# Patient Record
Sex: Male | Born: 1940
Health system: Southern US, Community
[De-identification: ages and names within clinical notes are randomized; demographics above are authoritative.]

## PROBLEM LIST (undated history)

## (undated) DIAGNOSIS — J189 Pneumonia, unspecified organism: Secondary | ICD-10-CM

## (undated) DIAGNOSIS — Z87442 Personal history of urinary calculi: Secondary | ICD-10-CM

## (undated) DIAGNOSIS — I10 Essential (primary) hypertension: Secondary | ICD-10-CM

## (undated) DIAGNOSIS — M48061 Spinal stenosis, lumbar region without neurogenic claudication: Secondary | ICD-10-CM

## (undated) DIAGNOSIS — R609 Edema, unspecified: Secondary | ICD-10-CM

## (undated) DIAGNOSIS — M1990 Unspecified osteoarthritis, unspecified site: Secondary | ICD-10-CM

## (undated) HISTORY — PX: OTHER SURGICAL HISTORY: SHX169

---

## 2015-05-03 ENCOUNTER — Other Ambulatory Visit: Payer: Self-pay | Admitting: Orthopedic Surgery

## 2015-05-03 DIAGNOSIS — M48061 Spinal stenosis, lumbar region without neurogenic claudication: Secondary | ICD-10-CM

## 2015-05-23 ENCOUNTER — Ambulatory Visit
Admission: RE | Admit: 2015-05-23 | Discharge: 2015-05-23 | Disposition: A | Discharge status: Home / Self Care | Payer: Medicare Other | Source: Ambulatory Visit | Attending: Orthopedic Surgery | Admitting: Orthopedic Surgery

## 2015-05-23 DIAGNOSIS — M48061 Spinal stenosis, lumbar region without neurogenic claudication: Secondary | ICD-10-CM

## 2015-05-23 MED ORDER — IOHEXOL 180 MG/ML  SOLN
15.0000 mL | Freq: Once | INTRAMUSCULAR | Status: DC | PRN
  Administered 2015-05-23: 15 mL via INTRATHECAL

## 2015-05-23 NOTE — Discharge Instructions (Signed)

## 2015-05-31 ENCOUNTER — Other Ambulatory Visit: Payer: Self-pay | Admitting: Surgical

## 2015-06-06 ENCOUNTER — Other Ambulatory Visit (HOSPITAL_COMMUNITY): Payer: Self-pay | Admitting: *Deleted

## 2015-06-06 NOTE — Patient Instructions (Signed)
Jim Murray  06/06/2015   Your procedure is scheduled on: 06-14-15  [facility]  [facility] to 3rd floor to  Short Stay Center at  630 AM.  Call this number if you have problems the morning of surgery [phone]   Remember: ONLY 1 PERSON MAY GO WITH YOU TO SHORT STAY TO GET  READY MORNING OF YOUR SURGERY.  Do not eat food or drink liquids :After Midnight.     Take these medicines the morning of surgery with A SIP OF WATER: none DO NOT TAKE ANY DIABETIC MEDICATIONS DAY OF YOUR SURGERY                               You may not have any metal on your body including hair pins and              piercings  Do not wear jewelry, make-up, lotions, powders or perfumes, deodorant             Do not wear nail polish.  Do not shave  48 hours prior to surgery.              Men may shave face and neck.   Do not bring valuables to the hospital. [facility] IS NOT             RESPONSIBLE   FOR VALUABLES.  Contacts, dentures or bridgework may not be worn into surgery.  Leave suitcase in the car. After surgery it may be brought to your room.     Patients discharged the day of surgery will not be allowed to drive home.  Name and phone number of your driver:  Special Instructions: N/A              Please read over the following fact sheets you were given: _____________________________________________________________________             [facility] - Preparing for [facility] Before surgery, you can play an important role.  Because skin is not sterile, your skin needs to be as free of germs as possible.  You can reduce the number of germs on your skin by washing with CHG (chlorahexidine gluconate) soap before surgery.  CHG is an antiseptic cleaner which kills germs and bonds with the skin to continue killing germs even after washing. Please DO NOT use if you have an allergy to CHG or antibacterial soaps.  If your skin becomes  reddened/irritated stop using the CHG and inform your nurse when you arrive at Short Stay. Do not shave (including legs and underarms) for at least 48 hours prior to the first CHG shower.  You may shave your face/neck. Please follow these instructions carefully:  1.  Shower with CHG Soap the night before surgery and the  morning of Surgery.  2.  If you choose to wash your hair, wash your hair first as usual with your  normal  shampoo.  3.  After you shampoo, rinse your hair and body thoroughly to remove the  shampoo.                           4.  Use CHG as you would any other liquid soap.  You can apply chg directly  to the skin and wash  Gently with a scrungie or clean washcloth.  5.  Apply the CHG Soap to your body ONLY FROM THE NECK DOWN.   Do not use on face/ open                           Wound or open sores. Avoid contact with eyes, ears mouth and genitals (private parts).                       Wash face,  Genitals (private parts) with your normal soap.             6.  Wash thoroughly, paying special attention to the area where your surgery  will be performed.  7.  Thoroughly rinse your body with warm water from the neck down.  8.  DO NOT shower/wash with your normal soap after using and rinsing off  the CHG Soap.                9.  Pat yourself dry with a clean towel.            10.  Wear clean pajamas.            11.  Place clean sheets on your bed the night of your first shower and do not  sleep with pets. Day of Surgery : Do not apply any lotions/deodorants the morning of surgery.  Please wear clean clothes to the hospital/surgery center.  FAILURE TO FOLLOW THESE INSTRUCTIONS MAY RESULT IN THE CANCELLATION OF YOUR SURGERY PATIENT SIGNATURE_________________________________  NURSE SIGNATURE__________________________________  ________________________________________________________________________   Jim Murray  An incentive spirometer is a tool that  can help keep your lungs clear and active. This tool measures how well you are filling your lungs with each breath. Taking long deep breaths may help reverse or decrease the chance of developing breathing (pulmonary) problems (especially infection) following:  A long period of time when you are unable to move or be active. BEFORE THE PROCEDURE   If the spirometer includes an indicator to show your best effort, your nurse or respiratory therapist will set it to a desired goal.  If possible, sit up straight or lean slightly forward. Try not to slouch.  Hold the incentive spirometer in an upright position. INSTRUCTIONS FOR USE  1. Sit on the edge of your bed if possible, or sit up as far as you can in bed or on a chair. 2. Hold the incentive spirometer in an upright position. 3. Breathe out normally. 4. Place the mouthpiece in your mouth and seal your lips tightly around it. 5. Breathe in slowly and as deeply as possible, raising the piston or the ball toward the top of the column. 6. Hold your breath for 3-5 seconds or for as long as possible. Allow the piston or ball to fall to the bottom of the column. 7. Remove the mouthpiece from your mouth and breathe out normally. 8. Rest for a few seconds and repeat Steps 1 through 7 at least 10 times every 1-2 hours when you are awake. Take your time and take a few normal breaths between deep breaths. 9. The spirometer may include an indicator to show your best effort. Use the indicator as a goal to work toward during each repetition. 10. After each set of 10 deep breaths, practice coughing to be sure your lungs are clear. If you have an incision (the cut made at the time of surgery),  support your incision when coughing by placing a pillow or rolled up towels firmly against it. Once you are able to get out of bed, walk around indoors and cough well. You may stop using the incentive spirometer when instructed by your caregiver.  RISKS AND  COMPLICATIONS  Take your time so you do not get dizzy or light-headed.  If you are in pain, you may need to take or ask for pain medication before doing incentive spirometry. It is harder to take a deep breath if you are having pain. AFTER USE  Rest and breathe slowly and easily.  It can be helpful to keep track of a log of your progress. Your caregiver can provide you with a simple table to help with this. If you are using the spirometer at home, follow these instructions: [place] IF:   You are having difficultly using the spirometer.  You have trouble using the spirometer as often as instructed.  Your pain medication is not giving enough relief while using the spirometer.  You develop fever of 100.5 F (38.1 Murray) or higher. SEEK IMMEDIATE MEDICAL CARE IF:   You cough up bloody sputum that had not been present before.  You develop fever of 102 F (38.9 Murray) or greater.  You develop worsening pain at or near the incision site. MAKE SURE YOU:   Understand these instructions.  Will watch your condition.  Will get help right away if you are not doing well or get worse. Document Released: 12/09/2006 Document Revised: 10/21/2011 Document Reviewed: 02/09/2007 [facility] Patient Information 2014 [place], [place].   ________________________________________________________________________

## 2015-06-08 ENCOUNTER — Encounter (HOSPITAL_COMMUNITY)
Admission: RE | Admit: 2015-06-08 | Discharge: 2015-06-08 | Disposition: A | Discharge status: Home / Self Care | Payer: Medicare Other | Source: Ambulatory Visit | Attending: Orthopedic Surgery | Admitting: Orthopedic Surgery

## 2015-06-08 ENCOUNTER — Ambulatory Visit (HOSPITAL_COMMUNITY)
Admission: RE | Admit: 2015-06-08 | Discharge: 2015-06-08 | Disposition: A | Discharge status: Home / Self Care | Payer: Medicare Other | Source: Ambulatory Visit | Attending: Surgical | Admitting: Surgical

## 2015-06-08 ENCOUNTER — Encounter (HOSPITAL_COMMUNITY): Payer: Self-pay

## 2015-06-08 DIAGNOSIS — Z01812 Encounter for preprocedural laboratory examination: Secondary | ICD-10-CM | POA: Insufficient documentation

## 2015-06-08 DIAGNOSIS — M4800 Spinal stenosis, site unspecified: Secondary | ICD-10-CM | POA: Diagnosis not present

## 2015-06-08 DIAGNOSIS — Z01818 Encounter for other preprocedural examination: Secondary | ICD-10-CM

## 2015-06-08 DIAGNOSIS — M5134 Other intervertebral disc degeneration, thoracic region: Secondary | ICD-10-CM | POA: Insufficient documentation

## 2015-06-08 DIAGNOSIS — R001 Bradycardia, unspecified: Secondary | ICD-10-CM | POA: Insufficient documentation

## 2015-06-08 HISTORY — DX: Pneumonia, unspecified organism: J18.9

## 2015-06-08 HISTORY — DX: Spinal stenosis, lumbar region without neurogenic claudication: M48.061

## 2015-06-08 HISTORY — DX: Personal history of urinary calculi: Z87.442

## 2015-06-08 HISTORY — DX: Edema, unspecified: R60.9

## 2015-06-08 LAB — CBC WITH DIFFERENTIAL/PLATELET
Basophils Absolute: 0.1 10*3/uL (ref 0.0–0.1)
Basophils Relative: 1 %
Eosinophils Absolute: 0.2 10*3/uL (ref 0.0–0.7)
Eosinophils Relative: 4 %
HCT: 43.8 % (ref 39.0–52.0)
Hemoglobin: 14.3 g/dL (ref 13.0–17.0)
Lymphocytes Relative: 21 %
Lymphs Abs: 1.1 10*3/uL (ref 0.7–4.0)
MCH: 30 pg (ref 26.0–34.0)
MCHC: 32.6 g/dL (ref 30.0–36.0)
MCV: 91.8 fL (ref 78.0–100.0)
Monocytes Absolute: 0.7 10*3/uL (ref 0.1–1.0)
Monocytes Relative: 13 %
Neutro Abs: 3.2 10*3/uL (ref 1.7–7.7)
Neutrophils Relative %: 61 %
Platelets: 196 10*3/uL (ref 150–400)
RBC: 4.77 MIL/uL (ref 4.22–5.81)
RDW: 13.1 % (ref 11.5–15.5)
WBC: 5.1 10*3/uL (ref 4.0–10.5)

## 2015-06-08 LAB — PROTIME-INR
INR: 1.02 (ref 0.00–1.49)
Prothrombin Time: 13.6 seconds (ref 11.6–15.2)

## 2015-06-08 LAB — COMPREHENSIVE METABOLIC PANEL
ALT: 21 U/L (ref 17–63)
AST: 21 U/L (ref 15–41)
Albumin: 4.3 g/dL (ref 3.5–5.0)
Alkaline Phosphatase: 60 U/L (ref 38–126)
Anion gap: 5 (ref 5–15)
BUN: 15 mg/dL (ref 6–20)
CO2: 29 mmol/L (ref 22–32)
Calcium: 9.2 mg/dL (ref 8.9–10.3)
Chloride: 105 mmol/L (ref 101–111)
Creatinine, Ser: 0.85 mg/dL (ref 0.61–1.24)
GFR calc Af Amer: >60 mL/min (ref >60)
GFR calc non Af Amer: >60 mL/min (ref >60)
Glucose, Bld: 111 mg/dL — ABNORMAL HIGH (ref 65–99)
Potassium: 4.5 mmol/L (ref 3.5–5.1)
Sodium: 139 mmol/L (ref 135–145)
Total Bilirubin: 0.9 mg/dL (ref 0.3–1.2)
Total Protein: 7.5 g/dL (ref 6.5–8.1)

## 2015-06-08 LAB — SURGICAL PCR SCREEN
MRSA, PCR: NEGATIVE
STAPHYLOCOCCUS AUREUS: NEGATIVE

## 2015-06-08 NOTE — Progress Notes (Signed)
email confirmation on chart for spanish interpreter day of surgery 06-14-15 arrive 630 am [facility] short stay

## 2015-06-14 ENCOUNTER — Ambulatory Visit (HOSPITAL_COMMUNITY): Payer: Medicare Other

## 2015-06-14 ENCOUNTER — Ambulatory Visit (HOSPITAL_COMMUNITY): Payer: Medicare Other | Admitting: Anesthesiology

## 2015-06-14 ENCOUNTER — Encounter (HOSPITAL_COMMUNITY)
Admission: RE | Disposition: A | Discharge status: Home / Self Care | Payer: Self-pay | Source: Ambulatory Visit | Attending: Orthopedic Surgery

## 2015-06-14 ENCOUNTER — Observation Stay (HOSPITAL_COMMUNITY)
Admission: RE | Admit: 2015-06-14 | Discharge: 2015-06-15 | Disposition: A | Discharge status: Home / Self Care | Payer: Medicare Other | Source: Ambulatory Visit | Attending: Orthopedic Surgery | Admitting: Orthopedic Surgery

## 2015-06-14 ENCOUNTER — Encounter (HOSPITAL_COMMUNITY): Payer: Self-pay | Admitting: *Deleted

## 2015-06-14 DIAGNOSIS — M21371 Foot drop, right foot: Secondary | ICD-10-CM | POA: Diagnosis not present

## 2015-06-14 DIAGNOSIS — M4806 Spinal stenosis, lumbar region: Secondary | ICD-10-CM | POA: Diagnosis not present

## 2015-06-14 DIAGNOSIS — Z419 Encounter for procedure for purposes other than remedying health state, unspecified: Secondary | ICD-10-CM

## 2015-06-14 DIAGNOSIS — M5126 Other intervertebral disc displacement, lumbar region: Secondary | ICD-10-CM | POA: Diagnosis not present

## 2015-06-14 DIAGNOSIS — Z87442 Personal history of urinary calculi: Secondary | ICD-10-CM | POA: Diagnosis not present

## 2015-06-14 DIAGNOSIS — M48062 Spinal stenosis, lumbar region with neurogenic claudication: Secondary | ICD-10-CM | POA: Diagnosis present

## 2015-06-14 HISTORY — PX: LUMBAR LAMINECTOMY/DECOMPRESSION MICRODISCECTOMY: SHX5026

## 2015-06-14 SURGERY — LUMBAR LAMINECTOMY/DECOMPRESSION MICRODISCECTOMY 1 LEVEL
Anesthesia: Spinal | Site: Back | Laterality: Right

## 2015-06-14 MED ORDER — ACETAMINOPHEN 325 MG PO TABS: 650.0000 mg | ORAL_TABLET | ORAL | Status: DC | PRN

## 2015-06-14 MED ORDER — FLEET ENEMA 7-19 GM/118ML RE ENEM: 1.0000 | ENEMA | Freq: Once | RECTAL | Status: DC | PRN

## 2015-06-14 MED ORDER — GLYCOPYRROLATE 0.2 MG/ML IJ SOLN
INTRAMUSCULAR | Status: DC | PRN
  Administered 2015-06-14: 0.6 mg via INTRAVENOUS

## 2015-06-14 MED ORDER — PROPOFOL 10 MG/ML IV BOLUS
INTRAVENOUS | Status: AC
  Filled 2015-06-14: qty 20

## 2015-06-14 MED ORDER — PROPOFOL 10 MG/ML IV BOLUS
INTRAVENOUS | Status: DC | PRN
  Administered 2015-06-14: 150 mg via INTRAVENOUS

## 2015-06-14 MED ORDER — OXYCODONE-ACETAMINOPHEN 5-325 MG PO TABS
1.0000 | ORAL_TABLET | ORAL | Status: DC | PRN
  Administered 2015-06-14: 1 via ORAL
  Administered 2015-06-15 (×3): 2 via ORAL
  Filled 2015-06-14 (×3): qty 2
  Filled 2015-06-14: qty 1

## 2015-06-14 MED ORDER — ONDANSETRON HCL 4 MG/2ML IJ SOLN: 4.0000 mg | INTRAMUSCULAR | Status: DC | PRN

## 2015-06-14 MED ORDER — BUPIVACAINE-EPINEPHRINE 0.5% -1:200000 IJ SOLN
INTRAMUSCULAR | Status: DC | PRN
  Administered 2015-06-14: 20 mL

## 2015-06-14 MED ORDER — CEFAZOLIN SODIUM-DEXTROSE 2-3 GM-% IV SOLR
2.0000 g | INTRAVENOUS | Status: AC
  Administered 2015-06-14: 2 g via INTRAVENOUS

## 2015-06-14 MED ORDER — SODIUM CHLORIDE 0.9 % IR SOLN
Status: DC | PRN
  Administered 2015-06-14: 500 mL

## 2015-06-14 MED ORDER — LIDOCAINE HCL (CARDIAC) 20 MG/ML IV SOLN
INTRAVENOUS | Status: AC
  Filled 2015-06-14: qty 5

## 2015-06-14 MED ORDER — MENTHOL 3 MG MT LOZG: 1.0000 | LOZENGE | OROMUCOSAL | Status: DC | PRN

## 2015-06-14 MED ORDER — GLYCOPYRROLATE 0.2 MG/ML IJ SOLN
INTRAMUSCULAR | Status: AC
  Filled 2015-06-14: qty 3

## 2015-06-14 MED ORDER — LACTATED RINGERS IV SOLN
INTRAVENOUS | Status: DC
Start: 2015-06-14 — End: 2015-06-15
  Administered 2015-06-14 – 2015-06-15 (×2): via INTRAVENOUS

## 2015-06-14 MED ORDER — SUCCINYLCHOLINE CHLORIDE 20 MG/ML IJ SOLN
INTRAMUSCULAR | Status: DC | PRN
  Administered 2015-06-14: 100 mg via INTRAVENOUS

## 2015-06-14 MED ORDER — CEFAZOLIN SODIUM-DEXTROSE 2-3 GM-% IV SOLR
INTRAVENOUS | Status: AC
  Filled 2015-06-14: qty 50

## 2015-06-14 MED ORDER — HYDROCODONE-ACETAMINOPHEN 5-325 MG PO TABS: 1.0000 | ORAL_TABLET | ORAL | Status: DC | PRN

## 2015-06-14 MED ORDER — BISACODYL 5 MG PO TBEC: 5.0000 mg | DELAYED_RELEASE_TABLET | Freq: Every day | ORAL | Status: DC | PRN

## 2015-06-14 MED ORDER — NEOSTIGMINE METHYLSULFATE 10 MG/10ML IV SOLN
INTRAVENOUS | Status: DC | PRN
  Administered 2015-06-14: 4 mg via INTRAVENOUS

## 2015-06-14 MED ORDER — TAMSULOSIN HCL 0.4 MG PO CAPS
0.4000 mg | ORAL_CAPSULE | Freq: Every day | ORAL | Status: DC
  Administered 2015-06-14: 0.4 mg via ORAL
  Filled 2015-06-14 (×2): qty 1

## 2015-06-14 MED ORDER — THROMBIN 5000 UNITS EX SOLR
OROMUCOSAL | Status: DC | PRN
  Administered 2015-06-14: 10:00:00 via TOPICAL

## 2015-06-14 MED ORDER — FENTANYL CITRATE (PF) 250 MCG/5ML IJ SOLN
INTRAMUSCULAR | Status: AC
  Filled 2015-06-14: qty 25

## 2015-06-14 MED ORDER — BUPIVACAINE-EPINEPHRINE (PF) 0.5% -1:200000 IJ SOLN
INTRAMUSCULAR | Status: AC
  Filled 2015-06-14: qty 30

## 2015-06-14 MED ORDER — BACITRACIN-NEOMYCIN-POLYMYXIN 400-5-5000 EX OINT
TOPICAL_OINTMENT | CUTANEOUS | Status: AC
  Filled 2015-06-14: qty 1

## 2015-06-14 MED ORDER — MEPERIDINE HCL 50 MG/ML IJ SOLN: 6.2500 mg | INTRAMUSCULAR | Status: DC | PRN

## 2015-06-14 MED ORDER — PROMETHAZINE HCL 25 MG/ML IJ SOLN: 6.2500 mg | INTRAMUSCULAR | Status: DC | PRN

## 2015-06-14 MED ORDER — FENTANYL CITRATE (PF) 100 MCG/2ML IJ SOLN
INTRAMUSCULAR | Status: AC
  Filled 2015-06-14: qty 2

## 2015-06-14 MED ORDER — THROMBIN 5000 UNITS EX SOLR
CUTANEOUS | Status: AC
  Filled 2015-06-14: qty 10000

## 2015-06-14 MED ORDER — FENTANYL CITRATE (PF) 100 MCG/2ML IJ SOLN
25.0000 ug | INTRAMUSCULAR | Status: DC | PRN
  Administered 2015-06-14: 50 ug via INTRAVENOUS

## 2015-06-14 MED ORDER — SODIUM CHLORIDE 0.9 % IR SOLN
Status: AC
  Filled 2015-06-14: qty 1

## 2015-06-14 MED ORDER — ROCURONIUM BROMIDE 100 MG/10ML IV SOLN
INTRAVENOUS | Status: AC
  Filled 2015-06-14: qty 1

## 2015-06-14 MED ORDER — HYDROMORPHONE HCL 1 MG/ML IJ SOLN: 0.5000 mg | INTRAMUSCULAR | Status: DC | PRN

## 2015-06-14 MED ORDER — ONDANSETRON HCL 4 MG/2ML IJ SOLN
INTRAMUSCULAR | Status: DC | PRN
  Administered 2015-06-14: 4 mg via INTRAVENOUS

## 2015-06-14 MED ORDER — ONDANSETRON HCL 4 MG/2ML IJ SOLN
INTRAMUSCULAR | Status: AC
  Filled 2015-06-14: qty 2

## 2015-06-14 MED ORDER — ROCURONIUM BROMIDE 100 MG/10ML IV SOLN
INTRAVENOUS | Status: DC | PRN
  Administered 2015-06-14: 30 mg via INTRAVENOUS
  Administered 2015-06-14: 10 mg via INTRAVENOUS

## 2015-06-14 MED ORDER — BUPIVACAINE LIPOSOME 1.3 % IJ SUSP
20.0000 mL | Freq: Once | INTRAMUSCULAR | Status: AC
  Administered 2015-06-14: 20 mL
  Filled 2015-06-14: qty 20

## 2015-06-14 MED ORDER — DEXAMETHASONE SODIUM PHOSPHATE 10 MG/ML IJ SOLN
INTRAMUSCULAR | Status: DC | PRN
  Administered 2015-06-14: 10 mg via INTRAVENOUS

## 2015-06-14 MED ORDER — BACITRACIN-NEOMYCIN-POLYMYXIN 400-5-5000 EX OINT
TOPICAL_OINTMENT | CUTANEOUS | Status: DC | PRN
  Administered 2015-06-14: 1 via TOPICAL

## 2015-06-14 MED ORDER — METHOCARBAMOL 1000 MG/10ML IJ SOLN
500.0000 mg | Freq: Four times a day (QID) | INTRAMUSCULAR | Status: DC | PRN
  Administered 2015-06-14: 500 mg via INTRAVENOUS
  Filled 2015-06-14 (×2): qty 5

## 2015-06-14 MED ORDER — ACETAMINOPHEN 650 MG RE SUPP: 650.0000 mg | RECTAL | Status: DC | PRN

## 2015-06-14 MED ORDER — CEFAZOLIN SODIUM 1-5 GM-% IV SOLN
1.0000 g | Freq: Three times a day (TID) | INTRAVENOUS | Status: AC
  Administered 2015-06-14 – 2015-06-15 (×3): 1 g via INTRAVENOUS
  Filled 2015-06-14 (×3): qty 50

## 2015-06-14 MED ORDER — POLYETHYLENE GLYCOL 3350 17 G PO PACK: 17.0000 g | PACK | Freq: Every day | ORAL | Status: DC | PRN

## 2015-06-14 MED ORDER — LACTATED RINGERS IV SOLN
INTRAVENOUS | Status: DC
  Administered 2015-06-14 (×2): via INTRAVENOUS

## 2015-06-14 MED ORDER — NEOSTIGMINE METHYLSULFATE 10 MG/10ML IV SOLN
INTRAVENOUS | Status: AC
  Filled 2015-06-14: qty 1

## 2015-06-14 MED ORDER — FENTANYL CITRATE (PF) 250 MCG/5ML IJ SOLN
INTRAMUSCULAR | Status: DC | PRN
  Administered 2015-06-14 (×5): 50 ug via INTRAVENOUS

## 2015-06-14 MED ORDER — PHENOL 1.4 % MT LIQD: 1.0000 | OROMUCOSAL | Status: DC | PRN

## 2015-06-14 MED ORDER — METHOCARBAMOL 500 MG PO TABS
500.0000 mg | ORAL_TABLET | Freq: Four times a day (QID) | ORAL | Status: DC | PRN
  Administered 2015-06-15: 500 mg via ORAL
  Filled 2015-06-14: qty 1

## 2015-06-14 SURGICAL SUPPLY — 37 items
BAG ZIPLOCK 12X15 (MISCELLANEOUS) ×3 IMPLANT
CLEANER TIP ELECTROSURG 2X2 (MISCELLANEOUS) ×3 IMPLANT
DRAPE MICROSCOPE LEICA (MISCELLANEOUS) ×3 IMPLANT
DRAPE POUCH INSTRU U-SHP 10X18 (DRAPES) ×3 IMPLANT
DRAPE SHEET LG 3/4 BI-LAMINATE (DRAPES) ×3 IMPLANT
DRAPE SURG 17X11 SM STRL (DRAPES) ×3 IMPLANT
DRSG ADAPTIC 3X8 NADH LF (GAUZE/BANDAGES/DRESSINGS) ×3 IMPLANT
DRSG PAD ABDOMINAL 8X10 ST (GAUZE/BANDAGES/DRESSINGS) ×6 IMPLANT
DURAPREP 26ML APPLICATOR (WOUND CARE) ×3 IMPLANT
ELECT BLADE TIP CTD 4 INCH (ELECTRODE) ×3 IMPLANT
ELECT REM PT RETURN 9FT ADLT (ELECTROSURGICAL) ×3
ELECTRODE REM PT RTRN 9FT ADLT (ELECTROSURGICAL) ×1 IMPLANT
GAUZE SPONGE 4X4 12PLY STRL (GAUZE/BANDAGES/DRESSINGS) ×3 IMPLANT
GLOVE BIOGEL PI IND STRL 8 (GLOVE) ×1 IMPLANT
GLOVE BIOGEL PI INDICATOR 8 (GLOVE) ×2
GLOVE ECLIPSE 8.0 STRL XLNG CF (GLOVE) ×3 IMPLANT
GOWN STRL REUS W/TWL XL LVL3 (GOWN DISPOSABLE) ×6 IMPLANT
KIT BASIN OR (CUSTOM PROCEDURE TRAY) ×3 IMPLANT
KIT POSITIONING SURG ANDREWS (MISCELLANEOUS) ×3 IMPLANT
MANIFOLD NEPTUNE II (INSTRUMENTS) ×3 IMPLANT
NEEDLE HYPO 22GX1.5 SAFETY (NEEDLE) ×3 IMPLANT
NEEDLE SPNL 18GX3.5 QUINCKE PK (NEEDLE) ×6 IMPLANT
PACK LAMINECTOMY ORTHO (CUSTOM PROCEDURE TRAY) ×3 IMPLANT
PAD ABD 8X10 STRL (GAUZE/BANDAGES/DRESSINGS) ×3 IMPLANT
PATTIES SURGICAL .5 X.5 (GAUZE/BANDAGES/DRESSINGS) ×3 IMPLANT
PATTIES SURGICAL .75X.75 (GAUZE/BANDAGES/DRESSINGS) IMPLANT
PATTIES SURGICAL 1X1 (DISPOSABLE) IMPLANT
PEN SKIN MARKING BROAD (MISCELLANEOUS) ×3 IMPLANT
SPONGE LAP 4X18 X RAY DECT (DISPOSABLE) IMPLANT
SPONGE SURGIFOAM ABS GEL 100 (HEMOSTASIS) ×3 IMPLANT
STAPLER VISISTAT 35W (STAPLE) ×3 IMPLANT
SUT VIC AB 0 CT1 27 (SUTURE) ×2
SUT VIC AB 0 CT1 27XBRD ANTBC (SUTURE) ×1 IMPLANT
SUT VIC AB 1 CT1 27 (SUTURE) ×6
SUT VIC AB 1 CT1 27XBRD ANTBC (SUTURE) ×3 IMPLANT
SYR 20CC LL (SYRINGE) ×3 IMPLANT
TOWEL OR 17X26 10 PK STRL BLUE (TOWEL DISPOSABLE) ×3 IMPLANT

## 2015-06-14 NOTE — Interval H&P Note (Signed)
History and Physical Interval Note:  06/14/2015 8:21 AM  Jim Murray  has presented today for surgery, with the diagnosis of HERNIATED DISC SPINAL STEONSIS   The various methods of treatment have been discussed with the patient and family. After consideration of risks, benefits and other options for treatment, the patient has consented to  Procedure(s): DECOMPRESSION L4-L5 AND MICRODISCECTOMY L4-L5 RIGHT    (1 LEVEL) (Right) as a surgical intervention .  The patient's history has been reviewed, patient examined, no change in status, stable for surgery.  I have reviewed the patient's chart and labs.  Questions were answered to the patient's satisfaction.     Adrienna Karis A

## 2015-06-14 NOTE — Brief Op Note (Signed)
06/14/2015  10:43 AM  PATIENT:  Jim Murray  74 y.o. 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   POST-OPERATIVE DIAGNOSIS:  Same as Pre-Op  PROCEDURE:  Procedure(s): DECOMPRESSION LAMINECTOMY FOR SPINAL STENOSIS L4,5, MICRODISECTOMY L4,5 RIGHT FOR HERNIATED NUCLEUS PULPOSA (Right)  SURGEON:  Surgeon(s) and Role:    * Drucilla SchmidtJames P Aplington, MD - Assisting    * Ranee Gosselinonald Cherylyn Sundby, MD - :   ASSISTANTS: Marlowe KaysJames Aplington MD  ANESTHESIA:   general  EBL:  Total I/O In: 1000 [I.V.:1000] Out: 300 [Urine:200; Blood:100]  BLOOD ADMINISTERED:none  DRAINS: none   LOCAL MEDICATIONS USED:  MARCAINE   20cc of 0.50% with Epinephrine at start of case and 20cc of Exparel at the end of the case.  SPECIMEN:  Source of Specimen:  L-4-L-5 Interspace.  DISPOSITION OF SPECIMEN:  PATHOLOGY  COUNTS:  YES  TOURNIQUET:  * No tourniquets in log *  DICTATION: .Other Dictation: Dictation Number [id number]  PLAN OF CARE: Admit for overnight observation  PATIENT DISPOSITION:  PACU - hemodynamically stable.   Delay start of Pharmacological VTE agent (>24hrs) due to surgical blood loss or risk of bleeding: yes

## 2015-06-14 NOTE — Anesthesia Postprocedure Evaluation (Signed)
  Anesthesia Post-op Note  Patient: Jim Murray  Procedure(s) Performed: Procedure(s) (LRB): DECOMPRESSION LAMINECTOMY FOR SPINAL STENOSIS L4,5, MICRODISECTOMY L4,5 RIGHT FOR HERNIATED NUCLEUS PULPOSA (Right)  Patient Location: PACU  Anesthesia Type: General  Level of Consciousness: awake and alert   Airway and Oxygen Therapy: Patient Spontanous Breathing  Post-op Pain: mild  Post-op Assessment: Post-op Vital signs reviewed, Patient's Cardiovascular Status Stable, Respiratory Function Stable, Patent Airway and No signs of Nausea or vomiting  Last Vitals:  Filed Vitals:   06/14/15 1419  BP: 148/75  Pulse: 60  Temp: 36.1 C  Resp: 18    Post-op Vital Signs: stable   Complications: No apparent anesthesia complications

## 2015-06-14 NOTE — Anesthesia Procedure Notes (Signed)
Procedure Name: Intubation Date/Time: 06/14/2015 8:36 AM Performed by: Delphia GratesHANDLER, Tomothy Eddins Pre-anesthesia Checklist: Emergency Drugs available, Suction available, Patient being monitored and Patient identified Patient Re-evaluated:Patient Re-evaluated prior to inductionOxygen Delivery Method: Circle system utilized Preoxygenation: Pre-oxygenation with 100% oxygen Intubation Type: IV induction Laryngoscope Size: Mac and 4 Grade View: Grade II Tube type: Oral Tube size: 7.5 mm Number of attempts: 1 Airway Equipment and Method: Stylet Placement Confirmation: ETT inserted through vocal cords under direct vision,  breath sounds checked- equal and bilateral and positive ETCO2 Secured at: 22 cm Tube secured with: Tape Dental Injury: Teeth and Oropharynx as per pre-operative assessment  Difficulty Due To: Difficult Airway- due to limited oral opening

## 2015-06-14 NOTE — Anesthesia Preprocedure Evaluation (Addendum)
Anesthesia Evaluation  Patient identified by MRN, date of birth, ID band Patient awake    Reviewed: Allergy & Precautions, NPO status , Patient's Chart, lab work & pertinent test results  Airway Mallampati: II  TM Distance: >3 FB Neck ROM: Full    Dental no notable dental hx.    Pulmonary neg pulmonary ROS,    Pulmonary exam normal breath sounds clear to auscultation       Cardiovascular negative cardio ROS Normal cardiovascular exam Rhythm:Regular Rate:Normal     Neuro/Psych negative neurological ROS  negative psych ROS   GI/Hepatic negative GI ROS, Neg liver ROS,   Endo/Other  negative endocrine ROS  Renal/GU negative Renal ROS  negative genitourinary   Musculoskeletal negative musculoskeletal ROS (+)   Abdominal   Peds negative pediatric ROS (+)  Hematology negative hematology ROS (+)   Anesthesia Other Findings   Reproductive/Obstetrics negative OB ROS                             Anesthesia Physical Anesthesia Plan  ASA: II  Anesthesia Plan: General   Post-op Pain Management:    Induction: Intravenous  Airway Management Planned: Oral ETT  Additional Equipment:   Intra-op Plan:   Post-operative Plan: Extubation in OR  Informed Consent: I have reviewed the patients History and Physical, chart, labs and discussed the procedure including the risks, benefits and alternatives for the proposed anesthesia with the patient or authorized representative who has indicated his/her understanding and acceptance.   Dental advisory given  Plan Discussed with: CRNA  Anesthesia Plan Comments:         Anesthesia Quick Evaluation  

## 2015-06-14 NOTE — H&P (Signed)
Jim Murray is an 74 y.o. male.   Chief Complaint: Pain in his back and Right Leg HPI: Progressive Right Leg pain and weakness of his Right Foot.  Past Medical History  Diagnosis Date  . Swelling     right knee at times  . Pneumonia years ago  . Lumbar spinal stenosis   . History of kidney stones 2-3 weeks ago    [place]    Past Surgical History  Procedure Laterality Date  . Surgery for kidney stones  3 weeks ago  . Cyst removed from right leg  8 months ago  . Surgery for right leg fracture  10 years ago  . Left hand surgery for fracture  many years ago    History reviewed. No pertinent family history. Social History:  reports that he has never smoked. He has never used smokeless tobacco. He reports that he does not drink alcohol or use illicit drugs.  Allergies: No Known Allergies  Medications Prior to Admission  Medication Sig Dispense Refill  . HYDROcodone-acetaminophen (NORCO) 10-325 MG tablet Take 1 tablet by mouth every 6 (six) hours as needed (1-2 tablets every six hours as needed for pain).    . tamsulosin (FLOMAX) 0.4 MG CAPS capsule Take 0.4 mg by mouth daily after supper.     Marland Kitchen. UNABLE TO FIND Bee therapy  2 caps per day      No results found for this or any previous visit (from the past 48 hour(s)). No results found.  Review of Systems  Constitutional: Negative.   HENT: Negative.   Eyes: Negative.   Respiratory: Negative.   Cardiovascular: Negative.   Gastrointestinal: Negative.   Genitourinary: Negative.   Musculoskeletal: Positive for back pain.  Skin: Negative.   Neurological: Positive for focal weakness.  Endo/Heme/Allergies: Negative.   Psychiatric/Behavioral: Negative.     Blood pressure 163/88, pulse 59, temperature 97.8 F (36.6 C), temperature source Oral, resp. rate 18, height 5\' 11"  (1.803 m), weight 104.866 kg (231 lb 3 oz), SpO2 98 %. Physical Exam  Constitutional: He appears well-developed.  HENT:  Head: Normocephalic.   Eyes: Pupils are equal, round, and reactive to light.  Neck: Normal range of motion.  Cardiovascular: Normal rate.   Respiratory: Effort normal.  GI: Soft.  Musculoskeletal:  Weakness of his Right Foot Dorsiflexors.  Neurological:  Weakness of his right foot Dorsiflexors.  Skin: Skin is warm.     Assessment/Plan Decompressive Lumbar Laminectomy and Microdiscectomy at L-4-L-5 on the Right.  Jim Murray A 06/14/2015, 8:16 AM

## 2015-06-14 NOTE — Transfer of Care (Signed)
Immediate Anesthesia Transfer of Care Note  Patient: Jim Murray  Procedure(s) Performed: Procedure(s): DECOMPRESSION LAMINECTOMY FOR SPINAL STENOSIS L4,5, MICRODISECTOMY L4,5 RIGHT FOR HERNIATED NUCLEUS PULPOSA (Right)  Patient Location: PACU  Anesthesia Type:General  Level of Consciousness: awake, alert , oriented and patient cooperative  Airway & Oxygen Therapy: Patient Spontanous Breathing and Patient connected to face mask oxygen  Post-op Assessment: Report given to RN and Post -op Vital signs reviewed and stable  Post vital signs: Reviewed and stable  Last Vitals:  Filed Vitals:   06/14/15 0555  BP: 163/88  Pulse: 59  Temp: 36.6 C  Resp: 18    Complications: No apparent anesthesia complications

## 2015-06-14 NOTE — Discharge Instructions (Addendum)
For the first few days, remove your dressing, tape a piece of saran wrap over your incision.  Take your shower, then remove the saran wrap and put a clean dressing on. After two days you can shower without the saran wrap.  Take aspirin 325mg  twice daily to prevent blood clots. Call Dr. Darrelyn HillockGioffre if any wound complications or temperature of 101 degrees F or over.

## 2015-06-15 DIAGNOSIS — M5126 Other intervertebral disc displacement, lumbar region: Secondary | ICD-10-CM | POA: Diagnosis not present

## 2015-06-15 MED ORDER — METHOCARBAMOL 500 MG PO TABS: 500.0000 mg | ORAL_TABLET | Freq: Four times a day (QID) | ORAL | Status: DC | PRN

## 2015-06-15 MED ORDER — OXYCODONE-ACETAMINOPHEN 5-325 MG PO TABS: 1.0000 | ORAL_TABLET | ORAL | Status: DC | PRN

## 2015-06-15 NOTE — Op Note (Signed)
Jim Murray:  Jim Murray, Jim Murray         ACCOUNT NO.:  [id number]  MEDICAL RECORD NO.:  [id number]  LOCATION:  1609                         [facility]  PHYSICIAN:  Georges Lynchonald A. Silas Sedam, M.D.DATE OF BIRTH:  [birth year]  DATE OF PROCEDURE:  06/14/2015 DATE OF DISCHARGE:                              OPERATIVE REPORT   SURGEON:  Georges Lynchonald A. Darrelyn HillockGioffre, M.D.  ASSISTANT:  Marlowe KaysJames Aplington, M.D.  PREOPERATIVE DIAGNOSES: 1. Herniated lumbar disc at L4-5 on the right. 2. Spinal stenosis at L4-5. 3. Partial footdrop on the right.  POSTOPERATIVE DIAGNOSES: 1. Herniated lumbar disc at L4-5 on the right. 2. Spinal stenosis at L4-5. 3. Partial footdrop on the right.  OPERATION: 1. Complete decompressive lumbar laminectomy at L4-5 for spinal     stenosis. 2. Microdiscectomy at L4-5 on the right.  The specimen was sent to the     lab.  DESCRIPTION OF PROCEDURE:  Under general anesthesia, routine orthopedic prepping and draping in the lower back was carried out with the patient on a spinal frame.  The appropriate time-out was first carried out.  I also marked the appropriate right side of his back his symptoms are on the right as well as his partial footdrop.  After the time-out etc., as I mentioned above 2 needles were placed in the back for localization purposes.  X-ray was taken.  At this time, an incision was made over the L4-5 interspace and the incision was extended proximally and distally. At this time.  The muscle was stripped from the lamina and spinous process bilaterally.  Self-retaining retractors were inserted.  Bleeding was under good control.  A Kocher clamp was in place and an x-ray was taken to verify the position.  We then carried out our central decompressive lumbar laminectomy.  The microscope was brought in.  We carried out the dissection especially out into the lateral right recess, which was extremely tight.  We removed the ligamentum flavum.  We protected the dura at all  times.  We cauterized lateral recess veins with a bipolar.  Following this, we put a needle in the disc space.  X- ray was taken to verify the position.  Note, the x-ray was taken prior to putting a needle in, but an instrument was placed right over the disc space prior to that.  Once the needle was placed in the disc space, we then made a cruciate incision in the posterior longitudinal ligament and carried out a microdiskectomy in the usual fashion.  We went up under the ligamentum flavum with a nerve hook as well as the Epstein curettes. We went proximally, distally, laterally, and medially, and compressed the disc into the space and then removed the rest of the disc.  Once this was completed, we did a nice foraminotomy as well for the L5 root. We then utilized a hockey-stick, went proximally distally over both foramina, foramina above and below as well as out on the opposite left side.  Left side was free.  There was no compression on the left.  We thoroughly irrigated out the area and loosely applied some thrombin- soaked Gelfoam.  Note, prior to the procedure, I injected 20 mL of 0.5% Marcaine and epinephrine to control the bleeding.  At the end of the procedure, I injected in the muscle with 20 mL of Exparel.  The wound then was closed in layers usual fashion.  I left a small distal and proximal part of the wound open for drainage purposes.  I closed the subcu with #1 Vicryl, skin with metal staples and a sterile Neosporin dressing was applied.  Right prior to surgery, he had 2 g of IV Ancef.  He had 2 g of IV Ancef.          ______________________________ Georges Lynch. Darrelyn Hillock, M.D.     RAG/MEDQ  D:  06/14/2015  T:  06/15/2015  Job:  [id number]

## 2015-06-15 NOTE — Progress Notes (Signed)
Subjective: 1 Day Post-Op Procedure(s) (LRB): DECOMPRESSION LAMINECTOMY FOR SPINAL STENOSIS L4,5, MICRODISECTOMY L4,5 RIGHT FOR HERNIATED NUCLEUS PULPOSA (Right) Patient reports pain as 1 on 0-10 scale. Doing much better. Good strength in Both Feet   Objective: Vital signs in last 24 hours: Temp:  [97 F (36.1 C)-98.9 F (37.2 C)] 98.7 F (37.1 C) (11/03 0459) Pulse Rate:  [56-68] 64 (11/03 0459) Resp:  [14-18] 16 (11/03 0459) BP: (122-150)/(55-76) 130/55 mmHg (11/03 0459) SpO2:  [94 %-100 %] 95 % (11/03 0459) Weight:  [104.781 kg (231 lb)] 104.781 kg (231 lb) (11/02 1215)  Intake/Output from previous day: 11/02 0701 - 11/03 0700 In: 4656.7 [P.O.:1440; I.V.:3066.7; IV Piggyback:150] Out: 5075 [Urine:4975; Blood:100] Intake/Output this shift:    No results for input(s): HGB in the last 72 hours. No results for input(s): WBC, RBC, HCT, PLT in the last 72 hours. No results for input(s): NA, K, CL, CO2, BUN, CREATININE, GLUCOSE, CALCIUM in the last 72 hours. No results for input(s): LABPT, INR in the last 72 hours.  Neurologically intact Compartment soft  Assessment/Plan: 1 Day Post-Op Procedure(s) (LRB): DECOMPRESSION LAMINECTOMY FOR SPINAL STENOSIS L4,5, MICRODISECTOMY L4,5 RIGHT FOR HERNIATED NUCLEUS PULPOSA (Right) Up with therapy Discharge home with home health  Jim Murray A 06/15/2015, 7:13 AM

## 2015-06-15 NOTE — Evaluation (Signed)
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 No follow up PT recommended.Pt admitted with above diagnosis. Pt currently with functional limitations due to the deficits listed below (see PT Problem List).    Follow Up Recommendations No PT follow up    Equipment Recommendations  None recommended by PT    Recommendations for Other Services       Precautions / Restrictions Precautions Precautions: Back Precaution Comments: provided handout, reviewed verbally      Mobility  Bed Mobility Overal bed mobility: Needs Assistance Bed Mobility: Rolling;Sidelying to Sit Rolling: Supervision Sidelying to sit: Supervision       General bed mobility comments: cues for log roll and side to sit.  Transfers Overall transfer level: Needs assistance Equipment used: None Transfers: Sit to/from Stand Sit to Stand: Supervision         General transfer comment: cues for safety  Ambulation/Gait Ambulation/Gait assistance: Min guard Ambulation Distance (Feet): 300 Feet   Gait Pattern/deviations: Step-through pattern   Gait velocity interpretation: at or above normal speed for age/gender General Gait Details: cues for safety, IV poe for  about 150'  Stairs Stairs: Yes Stairs assistance: Supervision Stair Management: One rail Right Number of Stairs: 3 General stair comments: cues for safety.  Wheelchair Mobility    Modified Rankin (Stroke Patients Only)       Balance                                             Pertinent Vitals/Pain Pain Assessment: Faces Faces Pain Scale: Hurts little more Pain Location: R shoulder > back Pain Descriptors / Indicators: Discomfort;Guarding Pain  Intervention(s): Limited activity within patient's tolerance;Monitored during session    Home Living Family/patient expects to be discharged to:: Private residence Living Arrangements: Children Available Help at Discharge: Family Type of Home: House Home Access: Stairs to enter   Secretary/administratorntrance Stairs-Number of Steps: about 2-3 Home Layout: One level Home Equipment: Cane - single point      Prior Function Level of Independence: Independent               Hand Dominance        Extremity/Trunk Assessment               Lower Extremity Assessment: Overall WFL for tasks assessed      Cervical / Trunk Assessment: Normal  Communication   Communication: Prefers language other than English  Cognition Arousal/Alertness: Awake/alert Behavior During Therapy: WFL for tasks assessed/performed Overall Cognitive Status: Within Functional Limits for tasks assessed                      General Comments      Exercises        Assessment/Plan    PT Assessment Patent does not need any further PT services  PT Diagnosis Difficulty walking;Acute pain   PT Problem List    PT Treatment Interventions     PT Goals (Current goals can be found in the Care Plan section) Acute Rehab PT Goals Patient Stated Goal: to go home PT Goal Formulation: All assessment and education complete, DC therapy    Frequency  Barriers to discharge        Co-evaluation               End of Session   Activity Tolerance: Patient tolerated treatment well Patient left: in chair;with call bell/phone within reach (with OT) Nurse Communication: Mobility status    Functional Assessment Tool Used: clinical judgement Functional Limitation: Mobility: Walking and moving around Mobility: Walking and Moving Around Current Status (Z6109): At least 1 percent but less than 20 percent impaired, limited or restricted Mobility: Walking and Moving Around Goal Status [phone]): At least 1 percent  but less than 20 percent impaired, limited or restricted Mobility: Walking and Moving Around Discharge Status [phone]): At least 1 percent but less than 20 percent impaired, limited or restricted    Time: 0810-0837 PT Time Calculation (min) (ACUTE ONLY): 27 min   Charges:   PT Evaluation $Initial PT Evaluation Tier I: 1 Procedure PT Treatments $Gait Training: 8-22 mins   PT G Codes:   PT G-Codes **NOT FOR INPATIENT CLASS** Functional Assessment Tool Used: clinical judgement Functional Limitation: Mobility: Walking and moving around Mobility: Walking and Moving Around Current Status (B1478): At least 1 percent but less than 20 percent impaired, limited or restricted Mobility: Walking and Moving Around Goal Status [phone]): At least 1 percent but less than 20 percent impaired, limited or restricted Mobility: Walking and Moving Around Discharge Status [phone]): At least 1 percent but less than 20 percent impaired, limited or restricted    Rada Hay 06/15/2015, 9:31 AM Blanchard Kelch PT [phone]

## 2015-06-15 NOTE — Evaluation (Signed)
Occupational Therapy Evaluation Patient Details Name: Jim Murray MRN: [id number] DOB: 1940-12-17 Today's Date: 06/15/2015    History of Present Illness DECOMPRESSION LAMINECTOMY FOR SPINAL STENOSIS L4,5, MICRODISECTOMY L4,5 RIGHT FOR HERNIATED NUCLEUS PULPOSA (Right   Clinical Impression   This 74 year old man was admitted for the above surgery.  Will follow in acute setting to further educate family on precautions and DME/AE options.  Pt is very independent natured and needs cues for back precautions    Follow Up Recommendations  Supervision/Assistance - 24 hour    Equipment Recommendations  3 in 1 bedside comode    Recommendations for Other Services       Precautions / Restrictions Precautions Precautions: Back Precaution Comments: provided handout, reviewed verbally Restrictions Weight Bearing Restrictions: No      Mobility Bed Mobility Overal bed mobility: Needs Assistance Bed Mobility: Rolling;Sidelying to Sit Rolling: Supervision Sidelying to sit: Supervision       General bed mobility comments: oob  Transfers Overall transfer level: Needs assistance Equipment used: None Transfers: Sit to/from Stand Sit to Stand: Supervision         General transfer comment: cues for back precautions    Balance                                            ADL Overall ADL's : Needs assistance/impaired     Grooming: Supervision/safety;Standing   Upper Body Bathing: Supervision/ safety;Sitting   Lower Body Bathing: Minimal assistance;Sit to/from stand   Upper Body Dressing : Supervision/safety;Sitting   Lower Body Dressing: Sit to/from stand;With adaptive equipment;Minimal assistance   Toilet Transfer: Supervision/safety;Ambulation;BSC   Toileting- ArchitectClothing Manipulation and Hygiene: Supervision/safety;Sit to/from stand         General ADL Comments: pt has a cane, which he can use to don pants; able to do this this am with very  minimal help when pant leg got stuck.  pt has a tub/shower at home.  He is agreeable to 3:1 commode at home. Pt verbalizes that he understood all education. Will touch base with daughter when she comes in later about tub transfer and long sponge. Pt is very independent     Advice workerVision     Perception     Praxis      Pertinent Vitals/Pain Pain Assessment: 0-10 Pain Score: 5  Faces Pain Scale: Hurts little more Pain Location: R shoulder and back Pain Descriptors / Indicators: Discomfort Pain Intervention(s): Limited activity within patient's tolerance;Monitored during session;Repositioned (declines more pain medication)     Hand Dominance     Extremity/Trunk Assessment Upper Extremity Assessment Upper Extremity Assessment: Overall WFL for tasks assessed (RUE sore but able to lift to 90)      Cervical / Trunk Assessment Cervical / Trunk Assessment: Normal   Communication Communication Communication: Prefers language other than English   Cognition Arousal/Alertness: Awake/alert Behavior During Therapy: WFL for tasks assessed/performed Overall Cognitive Status: Within Functional Limits for tasks assessed                     General Comments       Exercises       Shoulder Instructions      Home Living Family/patient expects to be discharged to:: Private residence Living Arrangements: Children Available Help at Discharge: Family Type of Home: House Home Access: Stairs to enter Entergy CorporationEntrance Stairs-Number of Steps: about 2-3  Home Layout: One level     Bathroom Shower/Tub: Chief Strategy Officer: Standard     Home Equipment: Cane - single point          Prior Functioning/Environment Level of Independence: Independent             OT Diagnosis: Acute pain   OT Problem List: Decreased knowledge of precautions;Decreased knowledge of use of DME or AE;Pain   OT Treatment/Interventions: Self-care/ADL training;DME and/or AE  instruction;Patient/family education    OT Goals(Current goals can be found in the care plan section) Acute Rehab OT Goals Patient Stated Goal: to go home OT Goal Formulation: With patient Time For Goal Achievement: 06/16/15 Potential to Achieve Goals: Good ADL Goals Pt Will Perform Tub/Shower Transfer: with min guard assist;ambulating;Tub transfer Additional ADL Goal #1: daughter will verbalize understanding of back precautions and AE options vs assistance for ADLs  OT Frequency: Min 1X/week   Barriers to D/C:            Co-evaluation              End of Session    Activity Tolerance: Patient tolerated treatment well Patient left: in chair;with call bell/phone within reach   Time: 1610-9604 OT Time Calculation (min): 20 min Charges:  OT General Charges $OT Visit: 1 Procedure OT Evaluation $Initial OT Evaluation Tier I: 1 Procedure G-Codes: OT G-codes **NOT FOR INPATIENT CLASS** Functional Assessment Tool Used: clinical observation Functional Limitation: Self care Self Care Current Status (V4098): At least 1 percent but less than 20 percent impaired, limited or restricted Self Care Goal Status (J1914): At least 1 percent but less than 20 percent impaired, limited or restricted  [facility] 06/15/2015, 9:36 AM  Marica Otter, OTR/L [phone] 06/15/2015

## 2015-06-15 NOTE — Progress Notes (Signed)
Patient and daughter verbalized understanding of discharge instructions. Discharge instructions printed in Spanish and [place]. Patient does speak English moderately.  Prescriptions given. Patient is stable at discharge.

## 2015-06-15 NOTE — Care Management Note (Signed)
Case Management Note  Patient Details  Name: Jim Murray MRN: [id number] Date of Birth: 1941/03/14  Subjective/Objective:                   DECOMPRESSION LAMINECTOMY FOR SPINAL STENOSIS L4,5, MICRODISECTOMY L4,5 RIGHT FOR HERNIATED NUCLEUS PULPOSA (Right) Action/Plan: Discharge planning  Expected Discharge Date:  06/15/15               Expected Discharge Plan:  Home/Self Care  In-House Referral:     Discharge planning Services  CM Consult  Post Acute Care Choice:    Choice offered to:     DME Arranged:  3-N-1 DME Agency:  [facility]  HH Arranged:  NA HH Agency:  NA  Status of Service:  Completed, signed off  Medicare Important Message Given:    Date Medicare IM Given:    Medicare IM give by:    Date Additional Medicare IM Given:    Additional Medicare Important Message give by:     If discussed at Long Length of Stay Meetings, dates discussed:    Additional Comments: Cm called [facility] DME rep, Lecretia to please deliver the 3n1 to room so pt can discharge.  No other CM needs were communicated. Yves DillJeffries, Zury Fazzino Christine, RN 06/15/2015, 11:03 AM

## 2015-06-15 NOTE — Progress Notes (Signed)
OT Note:  Daughter present:  Reviewed back precautions/ADLs/DME and AE options with her. All education completed. Will sign off.  ShenandoahMaryellen Imelda Dandridge, OTR/L [phone] 06/15/2015

## 2015-06-19 NOTE — Discharge Summary (Signed)
Physician Discharge Summary   Patient ID: Jim Murray MRN: [id number] DOB/AGE: 04/29/1941 74 y.o.  Admit date: 06/14/2015 Discharge date: 06/15/2015  Primary Diagnosis: Lumbar spinal stenosis   Admission Diagnoses:  Past Medical History  Diagnosis Date  . Swelling     right knee at times  . Pneumonia years ago  . Lumbar spinal stenosis   . History of kidney stones 2-3 weeks ago    done in [place]   Discharge Diagnoses:   Active Problems:   Spinal stenosis, lumbar region, with neurogenic claudication  Estimated body mass index is 32.23 kg/(m^2) as calculated from the following:   Height as of this encounter: _0  (1.803 m).   Weight as of this encounter: 104.781 kg (231 lb).  Procedure:  Procedure(s) (LRB): DECOMPRESSION LAMINECTOMY FOR SPINAL STENOSIS L4,5, MICRODISECTOMY L4,5 RIGHT FOR HERNIATED NUCLEUS PULPOSA (Right)   Consults: None  HPI: Progressive Right Leg pain and weakness of his Right Foot   Laboratory Data: Hospital Outpatient Visit on 06/08/2015  Component Date Value Ref Range Status  . MRSA, PCR 06/08/2015 NEGATIVE  NEGATIVE Final  . Staphylococcus aureus 06/08/2015 NEGATIVE  NEGATIVE Final   Comment:        The Xpert SA Assay (FDA approved for NASAL specimens in patients over 78 years of age), is one component of a comprehensive surveillance program.  Test performance has been validated by [facility] for patients greater than or equal to 70 year old. It is not intended to diagnose infection nor to guide or monitor treatment.   . WBC 06/08/2015 5.1  4.0 - 10.5 K/uL Final  . RBC 06/08/2015 4.77  4.22 - 5.81 MIL/uL Final  . Hemoglobin 06/08/2015 14.3  13.0 - 17.0 g/dL Final  . HCT 06/08/2015 43.8  39.0 - 52.0 % Final  . MCV 06/08/2015 91.8  78.0 - 100.0 fL Final  . MCH 06/08/2015 30.0  26.0 - 34.0 pg Final  . MCHC 06/08/2015 32.6  30.0 - 36.0 g/dL Final  . RDW 06/08/2015 13.1  11.5 - 15.5 % Final  . Platelets 06/08/2015 196  150 - 400  K/uL Final  . Neutrophils Relative % 06/08/2015 61   Final  . Neutro Abs 06/08/2015 3.2  1.7 - 7.7 K/uL Final  . Lymphocytes Relative 06/08/2015 21   Final  . Lymphs Abs 06/08/2015 1.1  0.7 - 4.0 K/uL Final  . Monocytes Relative 06/08/2015 13   Final  . Monocytes Absolute 06/08/2015 0.7  0.1 - 1.0 K/uL Final  . Eosinophils Relative 06/08/2015 4   Final  . Eosinophils Absolute 06/08/2015 0.2  0.0 - 0.7 K/uL Final  . Basophils Relative 06/08/2015 1   Final  . Basophils Absolute 06/08/2015 0.1  0.0 - 0.1 K/uL Final  . Sodium 06/08/2015 139  135 - 145 mmol/L Final  . Potassium 06/08/2015 4.5  3.5 - 5.1 mmol/L Final  . Chloride 06/08/2015 105  101 - 111 mmol/L Final  . CO2 06/08/2015 29  22 - 32 mmol/L Final  . Glucose, Bld 06/08/2015 111* 65 - 99 mg/dL Final  . BUN 06/08/2015 15  6 - 20 mg/dL Final  . Creatinine, Ser 06/08/2015 0.85  0.61 - 1.24 mg/dL Final  . Calcium 06/08/2015 9.2  8.9 - 10.3 mg/dL Final  . Total Protein 06/08/2015 7.5  6.5 - 8.1 g/dL Final  . Albumin 06/08/2015 4.3  3.5 - 5.0 g/dL Final  . AST 06/08/2015 21  15 - 41 U/L Final  . ALT 06/08/2015 21  17 -  63 U/L Final  . Alkaline Phosphatase 06/08/2015 60  38 - 126 U/L Final  . Total Bilirubin 06/08/2015 0.9  0.3 - 1.2 mg/dL Final  . GFR calc non Af Amer 06/08/2015 >60  >60 mL/min Final  . GFR calc Af Amer 06/08/2015 >60  >60 mL/min Final   Comment: (NOTE) The eGFR has been calculated using the CKD EPI equation. This calculation has not been validated in all clinical situations. eGFR's persistently <60 mL/min signify possible Chronic Kidney Disease.   . Anion gap 06/08/2015 5  5 - 15 Final  . Prothrombin Time 06/08/2015 13.6  11.6 - 15.2 seconds Final  . INR 06/08/2015 1.02  0.00 - 1.49 Final     X-Rays:Dg Chest 2 View  06/08/2015  CLINICAL DATA:  Preoperative exam prior to lumbar surgery, asymptomatic, nonsmoker. EXAM: CHEST  2 VIEW COMPARISON:  None in PACs FINDINGS: The lungs are adequately inflated and clear.  The heart and pulmonary vascularity are normal. The mediastinum is normal in width. There is mild tortuosity of the descending thoracic aorta. There is no pleural effusion or pneumothorax. There is mild degenerative disc disease of the thoracic spine with prominent anterior endplate osteophytes at multiple mid and lower thoracic levels. IMPRESSION: There is no active cardiopulmonary disease. Electronically Signed   By: David  Martinique M.D.   On: 06/08/2015 11:06   Dg Lumbar Spine 2-3 Views  06/08/2015  CLINICAL DATA:  Preoperative examination prior to L4-L5 surgery. EXAM: LUMBAR SPINE - 2-3 VIEW COMPARISON:  Lumbar myelogram of June 23, 2015 FINDINGS: The lumbar vertebral bodies are preserved in height. There are anterior endplate osteophytes at L2-3 and at L3-4. There is facet joint hypertrophy at L4-5 and at L5-S1. There is no spondylolisthesis. IMPRESSION: Moderate facet joint hypertrophy bilaterally at L4-5 and L5-S1. There is no compression fracture, high-grade disc space narrowing nor other acute lumbar spine abnormality. Electronically Signed   By: David  Martinique M.D.   On: 06/08/2015 11:24   Ct Lumbar Spine W Contrast  05/23/2015  CLINICAL DATA:  Low back pain extending into the right lateral thigh. EXAM: LUMBAR MYELOGRAM FLUOROSCOPY TIME:  Fluoroscopy Time:  0 minutes 47 seconds Number of Acquired Images:  16 PROCEDURE: After thorough discussion of risks and benefits of the procedure including bleeding, infection, injury to nerves, blood vessels, adjacent structures as well as headache and CSF leak, written and oral informed consent was obtained. Consent was obtained by Dr. San Morelle. Time out form was completed. Patient was positioned prone on the fluoroscopy table. Local anesthesia was provided with 1% lidocaine without epinephrine after prepped and draped in the usual sterile fashion. Puncture was performed at L3-4 using a 3 1/2 inch 22-gauge spinal needle via left paramedian approach.  Using a single pass through the dura, the needle was placed within the thecal sac, with return of clear CSF. 15 mL of Omnipaque-180 was injected into the thecal sac, with normal opacification of the nerve roots and cauda equina consistent with free flow within the subarachnoid space. I personally performed the lumbar puncture and administered the intrathecal contrast. I also personally supervised acquisition of the myelogram images. TECHNIQUE: Contiguous axial images were obtained through the Lumbar spine after the intrathecal infusion of infusion. Coronal and sagittal reconstructions were obtained of the axial image sets. COMPARISON:  None available. FINDINGS: LUMBAR MYELOGRAM FINDINGS: Slight retrolisthesis is present at L2-3 with mild associated disc bulging. This does not change significantly with upright images, flexion, or extension. Minimal disc bulging is present at  L3-4 without significant stenosis. A broad-based disc protrusion is present at L4-5. Mild to moderate right and minimal left subarticular narrowing is present. The disc protrusion does not change significantly with upright images. It is partially reduced in flexion. The nerve roots otherwise fill normally on both sides. CT LUMBAR MYELOGRAM FINDINGS: The lumbar spine is imaged from T11-12 through L5-S1. Conus medullaris terminates at L1-2. 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 4 mm retrolisthesis is present at L2-3. Limited imaging of the abdomen is unremarkable. Mild facet the mild degenerative changes are present at the SI joints bilaterally. L1-2: The fused right-sided syndesmotic fights partially narrow the right foramen. The central canal and left foramen are patent. L2-3: There is mild uncovering of the disc. Mild facet hypertrophy and ligamentum flavum thickening are noted. There is some distortion of the central canal without significant stenosis. L3-4: Mild leftward disc bulging and bilateral facet  hypertrophy is noted. There is no focal stenosis. L4-5: A broad-based disc protrusion is present. Moderate facet hypertrophy and spurring is present on the right. This results in moderate right and mild left subarticular stenosis. The foramina patent. L5-S1: Moderate facet hypertrophy is present bilaterally. There is no significant disc protrusion or stenosis. IMPRESSION: LUMBAR MYELOGRAM IMPRESSION: 1. Broad-based disc protrusion at L4-5 with right greater than left subarticular stenosis. The disc is partially reduced in flexion. 2. Grade 1 retrolisthesis at L2-3 without significant change upon standing, flexion, or extension. 3. Mild disc bulging at L3-4 without significant stenosis. CT LUMBAR MYELOGRAM IMPRESSION: 1. 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 2. 4 mm retrolisthesis at L2-3 with mild disc bulging but no significant stenosis. 3. Mild leftward disc bulging at L3-4 without significant stenosis. 4. Moderate right and mild left subarticular stenosis at L4-5 secondary to broad-based disc protrusion and moderate facet hypertrophy. The foramina are patent. 5. Moderate facet hypertrophy at L5-S1 without significant disc protrusion or stenosis. Electronically Signed   By: San Morelle M.D.   On: 05/23/2015 12:08   Dg Spine Portable 1 View  06/14/2015  CLINICAL DATA:  Lumbar decompression EXAM: PORTABLE SPINE - 1 VIEW COMPARISON:  Study obtained earlier in the day FINDINGS: Cross-table lateral image labeled #4. Metallic probe tip is posterior to the L4-5 interspace. No fracture or spondylolisthesis. Areas of osteoarthritic change present. IMPRESSION: Metallic probe posterior to the L4-5 interspace level. Electronically Signed   By: Lowella Grip III M.D.   On: 06/14/2015 10:09   Dg Spine Portable 1 View  06/14/2015  CLINICAL DATA:  Intraoperative localization for spine surgery. EXAM: PORTABLE SPINE - 1 VIEW COMPARISON:  Earlier film,  same date. FINDINGS: There is a surgical instrument marking the L5 level. IMPRESSION: L5 level marked intraoperatively. Electronically Signed   By: Marijo Sanes M.D.   On: 06/14/2015 09:39   Dg Spine Portable 1 View  06/14/2015  CLINICAL DATA:  Lumbar decompression EXAM: PORTABLE SPINE - 1 VIEW COMPARISON:  Study obtained earlier in the day FINDINGS: Cross-table lateral lumbar images labeled #2. Metallic probe tip is posterior to the superior most aspect of the L5 vertebral body. No fracture or spondylolisthesis. Stable areas of arthropathy. IMPRESSION: Metallic probe tip is posterior to the superior most aspect of the L5 vertebral body. No fracture or spondylolisthesis. Electronically Signed   By: Lowella Grip III M.D.   On: 06/14/2015 09:30   Dg Spine Portable 1 View  06/14/2015  CLINICAL DATA:  Lumbar  decompression L4-5. EXAM: PORTABLE SPINE - 1 VIEW COMPARISON:  June 08, 2015 FINDINGS: Cross-table lateral image labeled #1. Metallic probe tips are posterior to the mid L4 vertebral body and L4-5 interspace levels. No fracture or spondylolisthesis. There is moderate disc space narrowing at L1-2 and L2-3. There are prominent anterior osteophytes at L2, L3, and L4. IMPRESSION: Metallic probe tips are posterior to the midportion of the L4 vertebral body and the L4-5 interspace levels. No fracture or spondylolisthesis. Areas of arthropathy, stable. Electronically Signed   By: Lowella Grip III M.D.   On: 06/14/2015 09:09   Dg Myelography Lumbar Inj Lumbosacral  05/23/2015  CLINICAL DATA:  Low back pain extending into the right lateral thigh. EXAM: LUMBAR MYELOGRAM FLUOROSCOPY TIME:  Fluoroscopy Time:  0 minutes 47 seconds Number of Acquired Images:  16 PROCEDURE: After thorough discussion of risks and benefits of the procedure including bleeding, infection, injury to nerves, blood vessels, adjacent structures as well as headache and CSF leak, written and oral informed consent was obtained.  Consent was obtained by Dr. San Morelle. Time out form was completed. Patient was positioned prone on the fluoroscopy table. Local anesthesia was provided with 1% lidocaine without epinephrine after prepped and draped in the usual sterile fashion. Puncture was performed at L3-4 using a 3 1/2 inch 22-gauge spinal needle via left paramedian approach. Using a single pass through the dura, the needle was placed within the thecal sac, with return of clear CSF. 15 mL of Omnipaque-180 was injected into the thecal sac, with normal opacification of the nerve roots and cauda equina consistent with free flow within the subarachnoid space. I personally performed the lumbar puncture and administered the intrathecal contrast. I also personally supervised acquisition of the myelogram images. TECHNIQUE: Contiguous axial images were obtained through the Lumbar spine after the intrathecal infusion of infusion. Coronal and sagittal reconstructions were obtained of the axial image sets. COMPARISON:  None available. FINDINGS: LUMBAR MYELOGRAM FINDINGS: Slight retrolisthesis is present at L2-3 with mild associated disc bulging. This does not change significantly with upright images, flexion, or extension. Minimal disc bulging is present at L3-4 without significant stenosis. A broad-based disc protrusion is present at L4-5. Mild to moderate right and minimal left subarticular narrowing is present. The disc protrusion does not change significantly with upright images. It is partially reduced in flexion. The nerve roots otherwise fill normally on both sides. CT LUMBAR MYELOGRAM FINDINGS: The lumbar spine is imaged from T11-12 through L5-S1. Conus medullaris terminates at L1-2. 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 4 mm retrolisthesis is present at L2-3. Limited imaging of the abdomen is unremarkable. Mild facet the mild degenerative changes are present at the SI joints bilaterally. L1-2: The fused  right-sided syndesmotic fights partially narrow the right foramen. The central canal and left foramen are patent. L2-3: There is mild uncovering of the disc. Mild facet hypertrophy and ligamentum flavum thickening are noted. There is some distortion of the central canal without significant stenosis. L3-4: Mild leftward disc bulging and bilateral facet hypertrophy is noted. There is no focal stenosis. L4-5: A broad-based disc protrusion is present. Moderate facet hypertrophy and spurring is present on the right. This results in moderate right and mild left subarticular stenosis. The foramina patent. L5-S1: Moderate facet hypertrophy is present bilaterally. There is no significant disc protrusion or stenosis. IMPRESSION: LUMBAR MYELOGRAM IMPRESSION: 1. Broad-based disc protrusion at L4-5 with right greater than left subarticular stenosis. The disc is partially reduced  in flexion. 2. Grade 1 retrolisthesis at L2-3 without significant change upon standing, flexion, or extension. 3. Mild disc bulging at L3-4 without significant stenosis. CT LUMBAR MYELOGRAM IMPRESSION: 1. 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 2. 4 mm retrolisthesis at L2-3 with mild disc bulging but no significant stenosis. 3. Mild leftward disc bulging at L3-4 without significant stenosis. 4. Moderate right and mild left subarticular stenosis at L4-5 secondary to broad-based disc protrusion and moderate facet hypertrophy. The foramina are patent. 5. Moderate facet hypertrophy at L5-S1 without significant disc protrusion or stenosis. Electronically Signed   By: San Morelle M.D.   On: 05/23/2015 12:08    EKG: Orders placed or performed during the hospital encounter of 06/08/15  . EKG  . EKG     Hospital Course: Jim Murray is a 73 y.o. who was admitted to [facility]. They were brought to the operating room on 06/14/2015 and underwent  Procedure(s): DECOMPRESSION LAMINECTOMY FOR SPINAL STENOSIS L4,5, MICRODISECTOMY L4,5 RIGHT FOR HERNIATED NUCLEUS PULPOSA.  Patient tolerated the procedure well and was later transferred to the recovery room and then to the orthopaedic floor for postoperative care.  They were given PO and IV analgesics for pain control following their surgery.  They were given 24 hours of postoperative antibiotics of  Anti-infectives    Start     Dose/Rate Route Frequency Ordered Stop   06/14/15 1700  ceFAZolin (ANCEF) IVPB 1 g/50 mL premix     1 g 100 mL/hr over 30 Minutes Intravenous 3 times per day 06/14/15 1217 06/15/15 0545   06/14/15 0907  polymyxin B 500,000 Units, bacitracin 50,000 Units in sodium chloride irrigation 0.9 % 500 mL irrigation  Status:  Discontinued       As needed 06/14/15 0907 06/14/15 1050   06/14/15 0556  ceFAZolin (ANCEF) IVPB 2 g/50 mL premix     2 g 100 mL/hr over 30 Minutes Intravenous On call to O.R. 06/14/15 7017 06/14/15 0840     and started on DVT prophylaxis in the form of Aspirin.   PT was ordered.  Discharge planning consulted to help with postop disposition and equipment needs.  Patient had a good night on the evening of surgery.   Dressing was changed and the incision was clean and dry.   Incision was healing well.  Patient was seen in rounds and was ready to go home.   Diet: Cardiac diet Activity:WBAT Follow-up:in 2 weeks Disposition - Home Discharged Condition: stable   Discharge Instructions    Call MD / Call 911    Complete by:  As directed   If you experience chest pain or shortness of breath, CALL 911 and be transported to the hospital emergency room.  If you develope a fever above 101 F, pus (white drainage) or increased drainage or redness at the wound, or calf pain, call your surgeon's office.     Constipation Prevention    Complete by:  As directed   Drink plenty of fluids.  Prune juice may be helpful.  You may use a stool softener, such as Colace (over the  counter) 100 mg twice a day.  Use MiraLax (over the counter) for constipation as needed.     Diet - low sodium heart healthy    Complete by:  As directed      Discharge instructions    Complete by:  As directed   For the first few days, remove your dressing, tape  a piece of saran wrap over your incision.  Take your shower, then remove the saran wrap and put a clean dressing on. After two days you can shower without the saran wrap.  Take aspirin 378m twice daily to prevent blood clots. Call Dr. GGladstone Lighterif any wound complications or temperature of 101 degrees F or over.  Call the office for an appointment to see Dr. GGladstone Lighterin two weeks: [phone] ask for Dr. GCharlestine Nightnurse, TBrunilda Payor     Driving restrictions    Complete by:  As directed   No driving     Increase activity slowly as tolerated    Complete by:  As directed      Lifting restrictions    Complete by:  As directed   No lifting            Medication List    STOP taking these medications        HYDROcodone-acetaminophen 10-325 MG tablet  Commonly known as:  NORCO      TAKE these medications        methocarbamol 500 MG tablet  Commonly known as:  ROBAXIN  Take 1 tablet (500 mg total) by mouth every 6 (six) hours as needed for muscle spasms.     oxyCODONE-acetaminophen 5-325 MG tablet  Commonly known as:  PERCOCET/ROXICET  Take 1-2 tablets by mouth every 4 (four) hours as needed for moderate pain.     tamsulosin 0.4 MG Caps capsule  Commonly known as:  FLOMAX  Take 0.4 mg by mouth daily after supper.     UNABLE TO FIND  Bee therapy  2 caps per day           Follow-up Information    Follow up with GIOFFRE,RONALD A, MD. Schedule an appointment as soon as possible for a visit in 2 weeks.   Specialty:  Orthopedic Surgery   Contact information:   [id number]      Follow up with [facility]   Why:  3n1 (over the commode  seat)   Contact information:   [address]HHeritage [phone]       Signed: AArdeen Jourdain PA-C Orthopaedic Surgery 06/19/2015, 8:45 AM

## 2016-05-08 DIAGNOSIS — Z299 Encounter for prophylactic measures, unspecified: Secondary | ICD-10-CM | POA: Diagnosis not present

## 2016-05-08 DIAGNOSIS — Z713 Dietary counseling and surveillance: Secondary | ICD-10-CM | POA: Diagnosis not present

## 2016-05-08 DIAGNOSIS — Z6832 Body mass index (BMI) 32.0-32.9, adult: Secondary | ICD-10-CM | POA: Diagnosis not present

## 2016-05-08 DIAGNOSIS — Z789 Other specified health status: Secondary | ICD-10-CM | POA: Diagnosis not present

## 2016-05-08 DIAGNOSIS — M1990 Unspecified osteoarthritis, unspecified site: Secondary | ICD-10-CM | POA: Diagnosis not present

## 2016-06-10 DIAGNOSIS — M1990 Unspecified osteoarthritis, unspecified site: Secondary | ICD-10-CM | POA: Diagnosis not present

## 2016-06-10 DIAGNOSIS — M1712 Unilateral primary osteoarthritis, left knee: Secondary | ICD-10-CM | POA: Diagnosis not present

## 2016-06-10 DIAGNOSIS — Z6832 Body mass index (BMI) 32.0-32.9, adult: Secondary | ICD-10-CM | POA: Diagnosis not present

## 2016-06-10 DIAGNOSIS — Z299 Encounter for prophylactic measures, unspecified: Secondary | ICD-10-CM | POA: Diagnosis not present

## 2016-06-10 DIAGNOSIS — M7520 Bicipital tendinitis, unspecified shoulder: Secondary | ICD-10-CM | POA: Diagnosis not present

## 2016-10-10 DIAGNOSIS — M1712 Unilateral primary osteoarthritis, left knee: Secondary | ICD-10-CM | POA: Diagnosis not present

## 2016-10-10 DIAGNOSIS — Z299 Encounter for prophylactic measures, unspecified: Secondary | ICD-10-CM | POA: Diagnosis not present

## 2016-10-10 DIAGNOSIS — Z789 Other specified health status: Secondary | ICD-10-CM | POA: Diagnosis not present

## 2016-10-10 DIAGNOSIS — Z6833 Body mass index (BMI) 33.0-33.9, adult: Secondary | ICD-10-CM | POA: Diagnosis not present

## 2016-11-08 IMAGING — CR DG CHEST 2V
2 series · 2 of 2 positions shown · non-contrast
Comparison: None in PACs

CLINICAL DATA: Preoperative exam prior to lumbar surgery,
asymptomatic, nonsmoker.

EXAM:
CHEST  2 VIEW

[w chest pa]
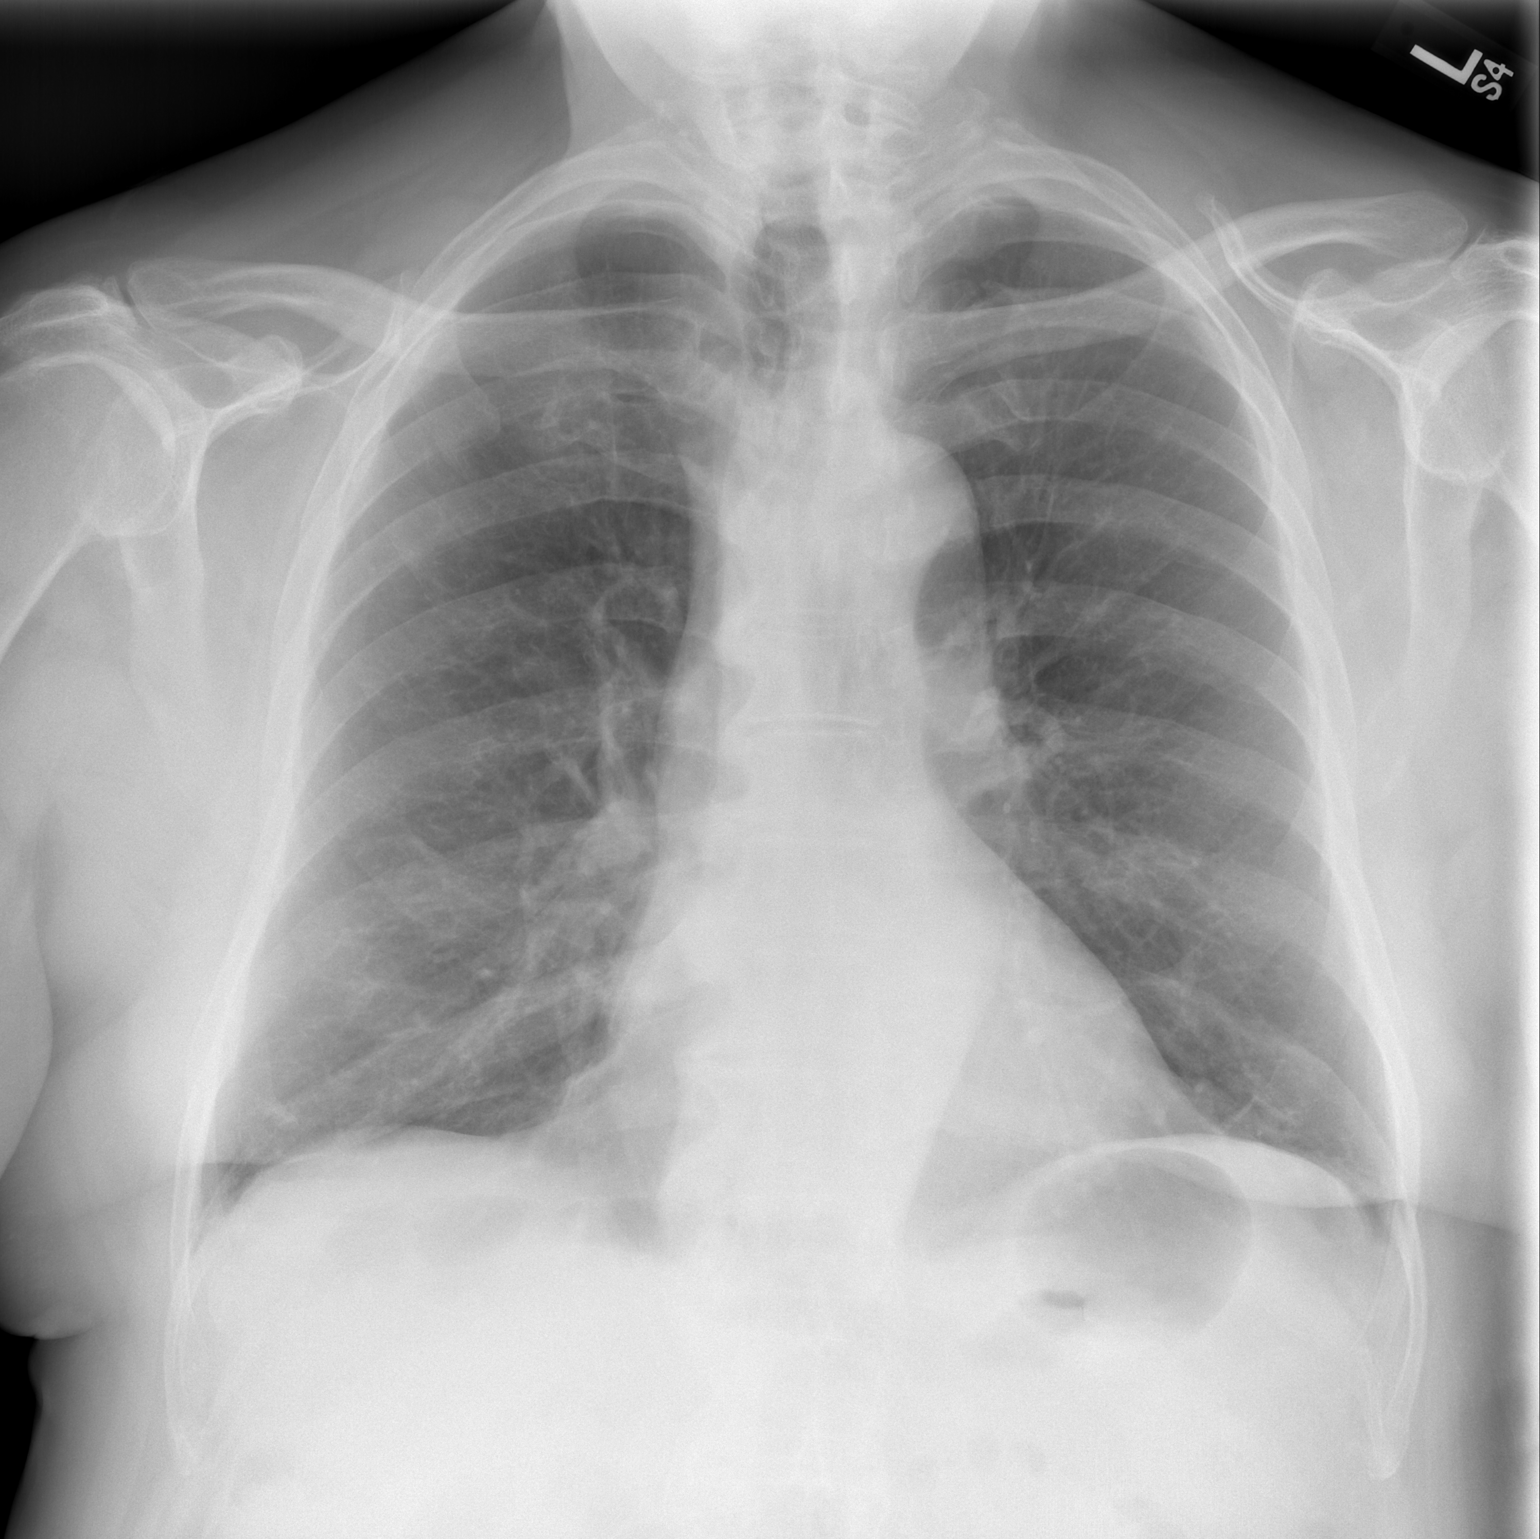

[w chest lat]
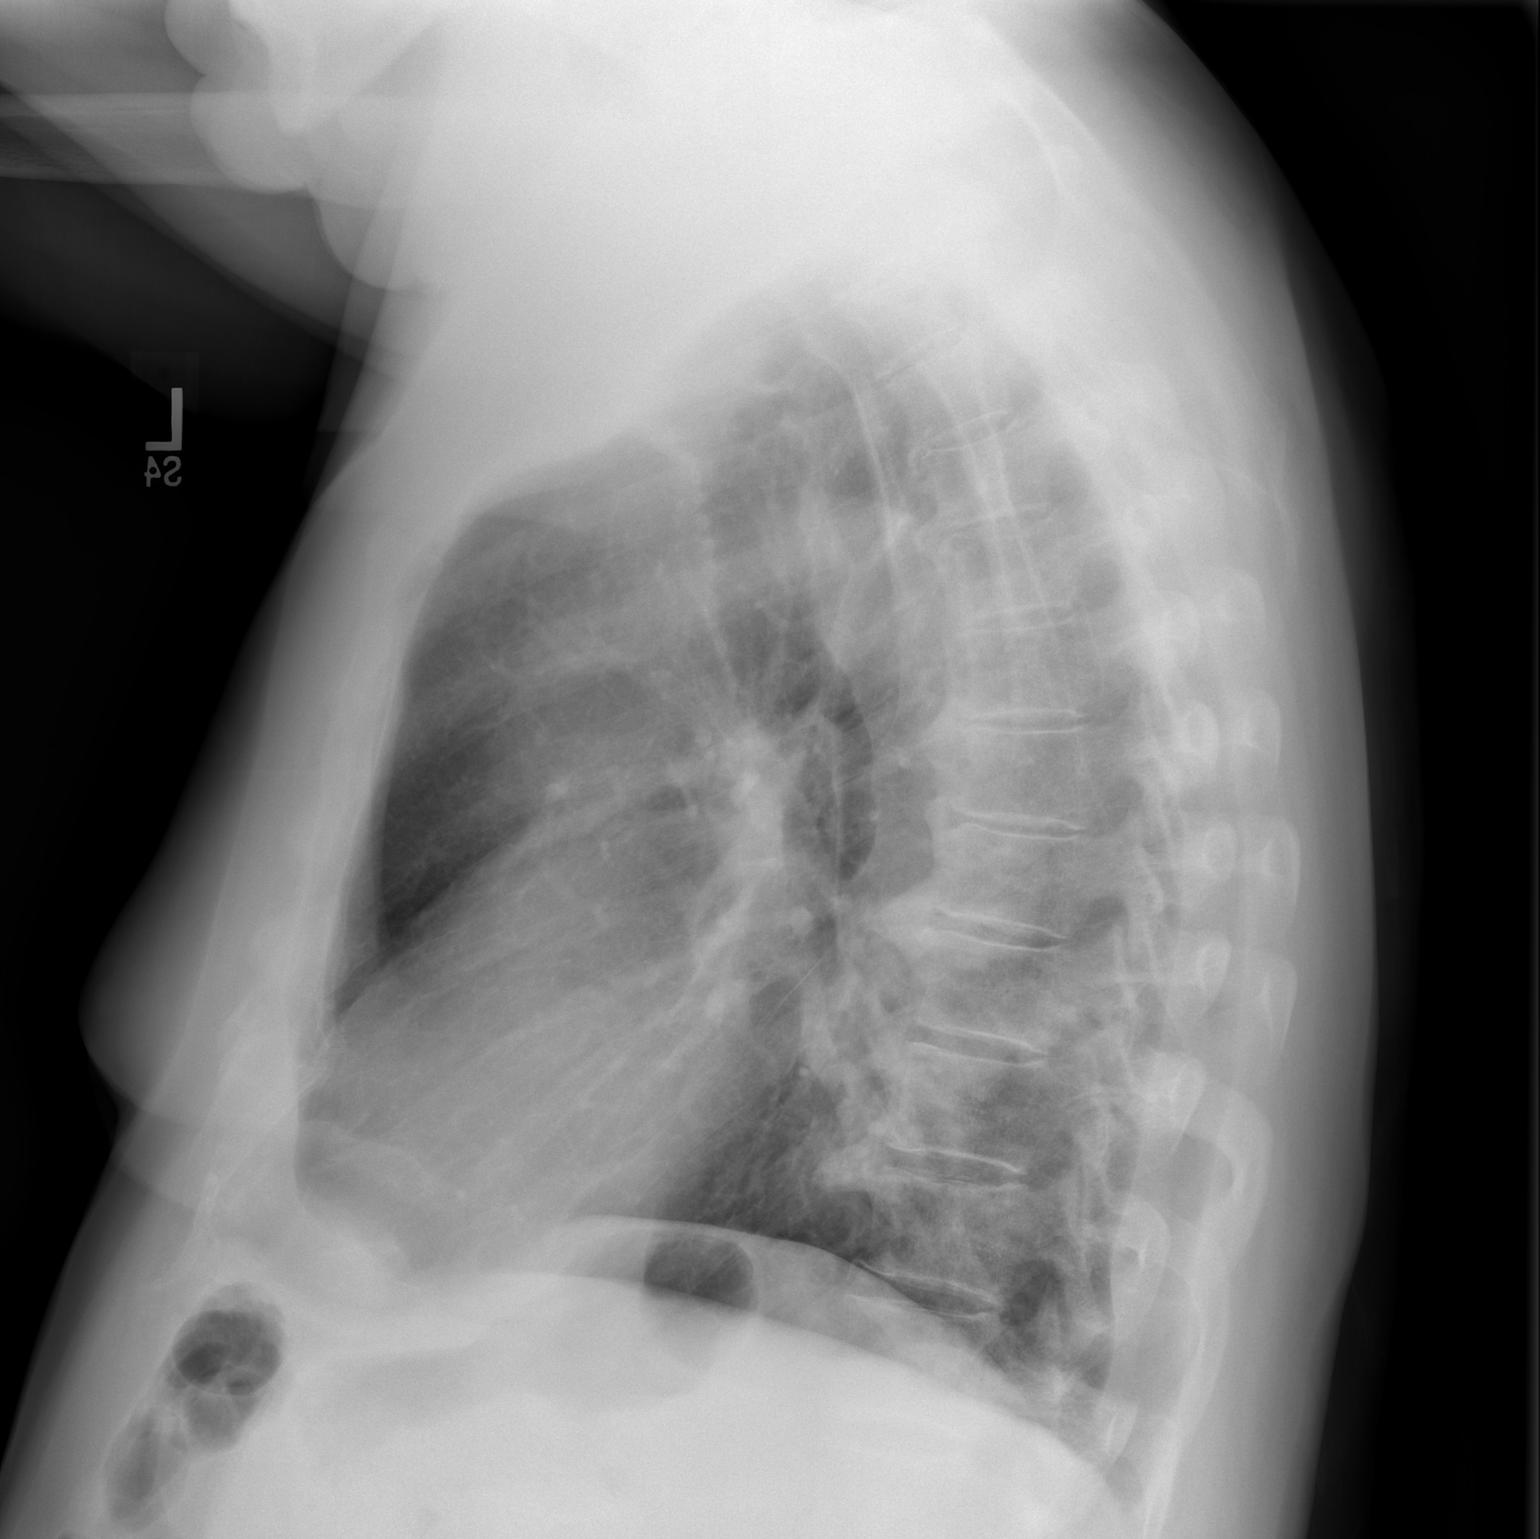

[2 of 2 positions shown; findings below may reference images not displayed]

FINDINGS: The lungs are adequately inflated and clear. The heart and pulmonary
vascularity are normal. The mediastinum is normal in width. There is
mild tortuosity of the descending thoracic aorta. There is no
pleural effusion or pneumothorax. There is mild degenerative disc
disease of the thoracic spine with prominent anterior endplate
osteophytes at multiple mid and lower thoracic levels.
IMPRESSION: There is no active cardiopulmonary disease.

## 2016-11-08 IMAGING — CR DG LUMBAR SPINE 2-3V
2 series · 2 of 2 positions shown · non-contrast
Comparison: Lumbar myelogram of June 23, 2015

CLINICAL DATA: Preoperative examination prior to L4-L5 surgery.

EXAM:
LUMBAR SPINE - 2-3 VIEW

[w l-spine a.p.]
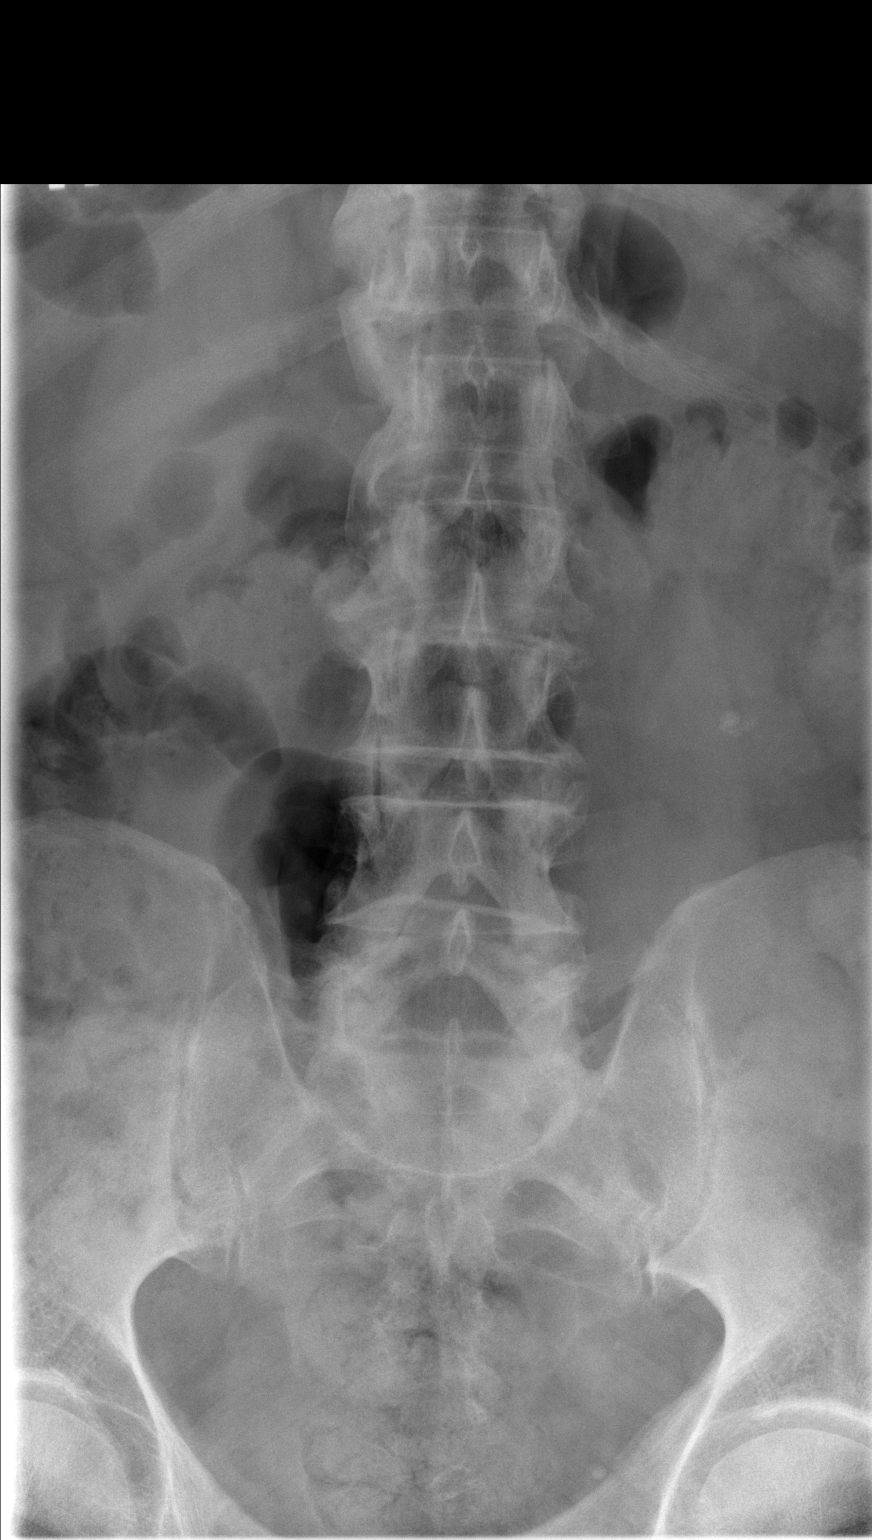

[w l-spine lat *]
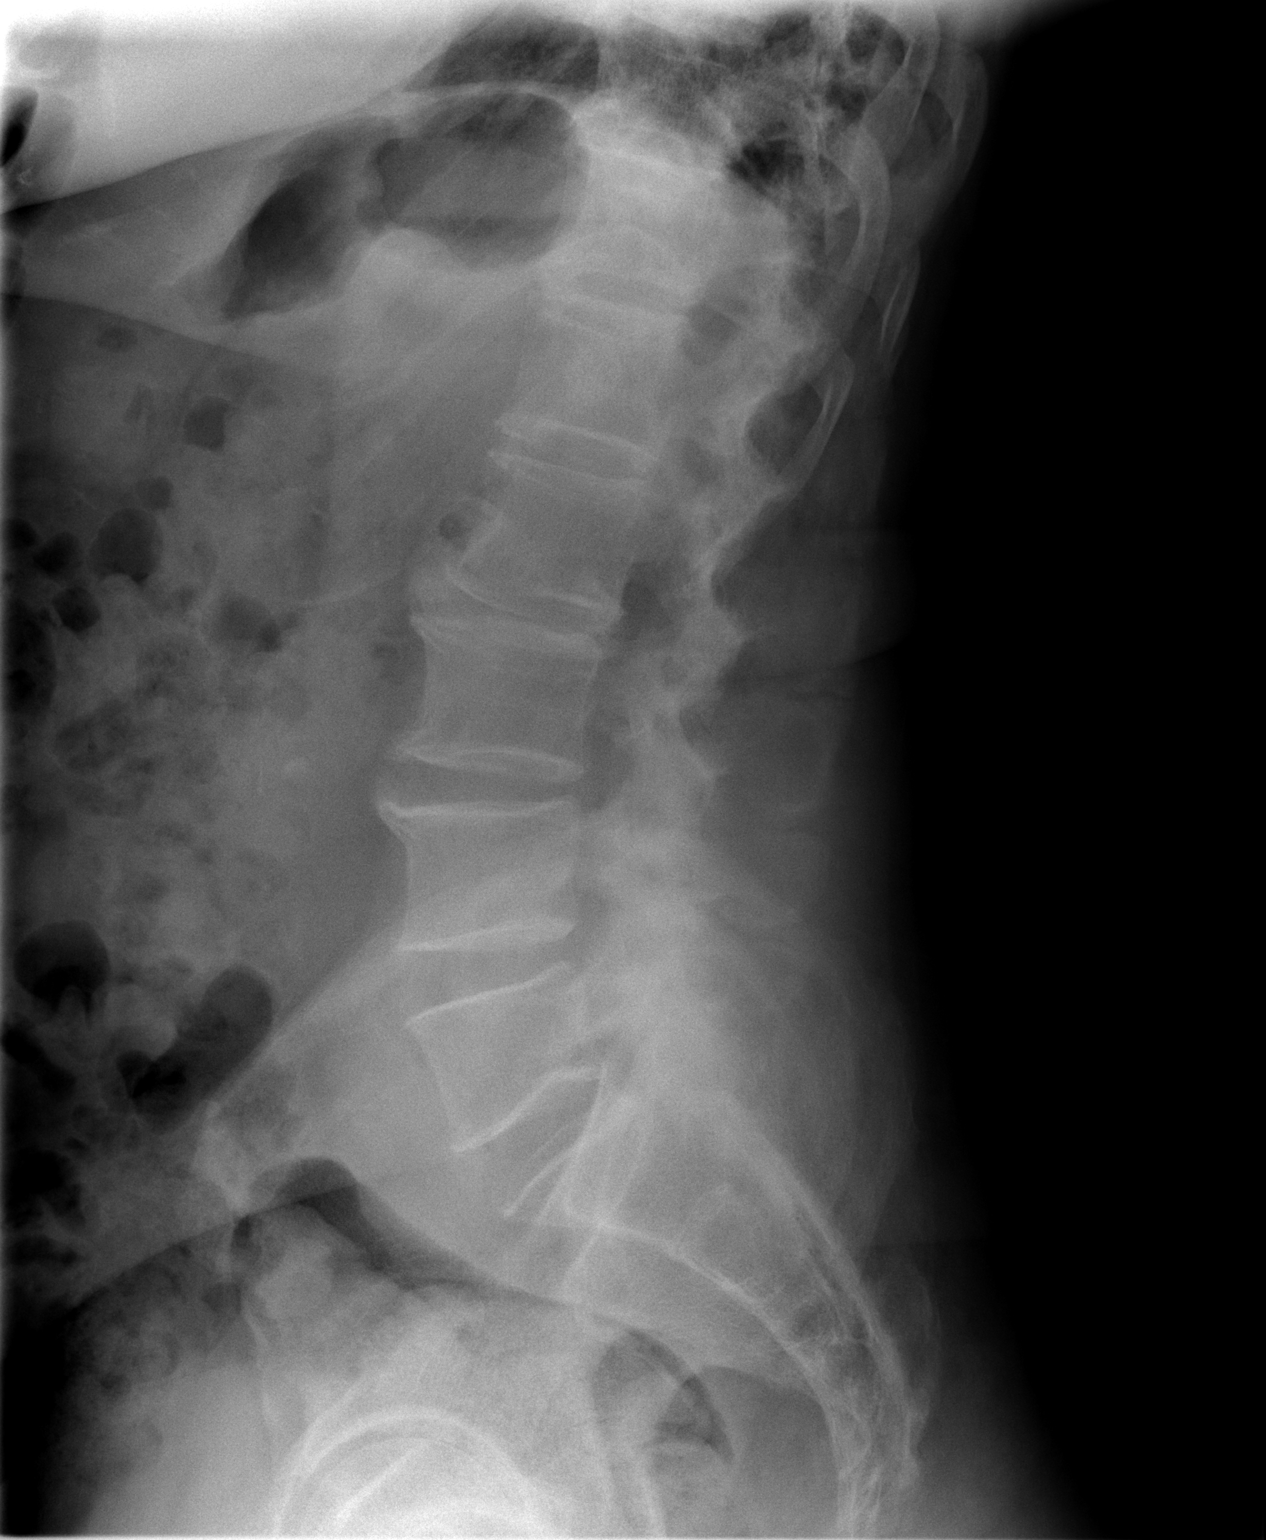

[2 of 2 positions shown; findings below may reference images not displayed]

FINDINGS: The lumbar vertebral bodies are preserved in height. There are
anterior endplate osteophytes at L2-3 and at L3-4. There is facet
joint hypertrophy at L4-5 and at L5-S1. There is no
spondylolisthesis.
IMPRESSION: Moderate facet joint hypertrophy bilaterally at L4-5 and L5-S1.
There is no compression fracture, high-grade disc space narrowing
nor other acute lumbar spine abnormality.

## 2016-11-13 DIAGNOSIS — M2550 Pain in unspecified joint: Secondary | ICD-10-CM | POA: Diagnosis not present

## 2016-11-13 DIAGNOSIS — J209 Acute bronchitis, unspecified: Secondary | ICD-10-CM | POA: Diagnosis not present

## 2016-11-13 DIAGNOSIS — Z789 Other specified health status: Secondary | ICD-10-CM | POA: Diagnosis not present

## 2016-11-13 DIAGNOSIS — Z299 Encounter for prophylactic measures, unspecified: Secondary | ICD-10-CM | POA: Diagnosis not present

## 2016-11-13 DIAGNOSIS — M1990 Unspecified osteoarthritis, unspecified site: Secondary | ICD-10-CM | POA: Diagnosis not present

## 2016-11-13 DIAGNOSIS — Z6832 Body mass index (BMI) 32.0-32.9, adult: Secondary | ICD-10-CM | POA: Diagnosis not present

## 2016-11-14 IMAGING — DX DG SPINE 1V PORT
1 series · 1 of 1 positions shown · non-contrast
Comparison: June 08, 2015

CLINICAL DATA: Lumbar decompression L4-5.

EXAM:
PORTABLE SPINE - 1 VIEW

[l-spine x-table]
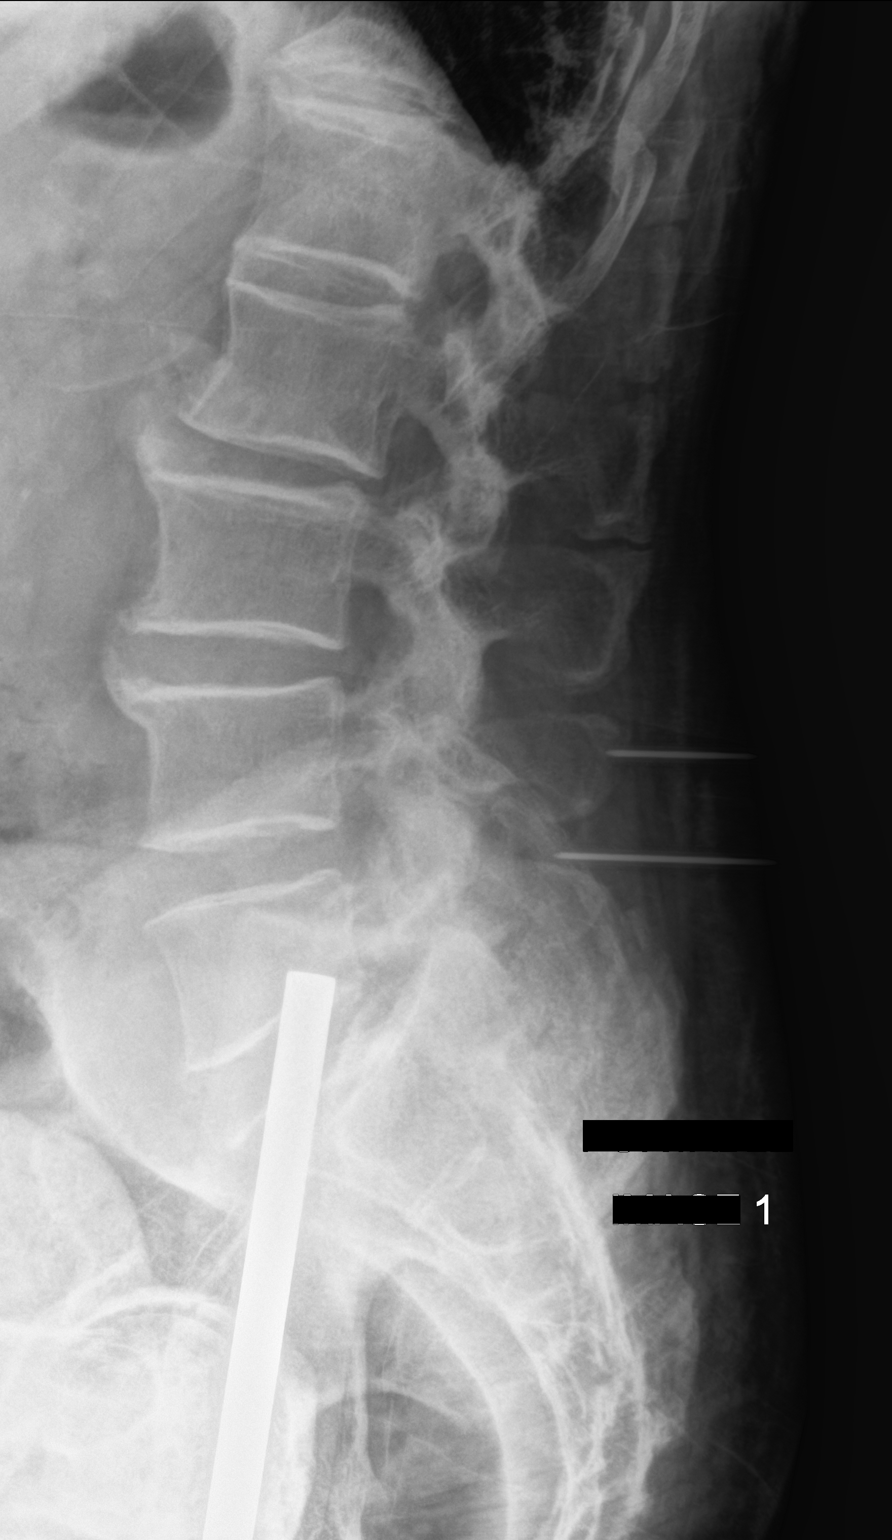

[1 of 1 positions shown; findings below may reference images not displayed]

FINDINGS: Cross-table lateral image labeled #1. Metallic probe tips are
posterior to the mid L4 vertebral body and L4-5 interspace levels.
No fracture or spondylolisthesis. There is moderate disc space
narrowing at L1-2 and L2-3. There are prominent anterior osteophytes
at L2, L3, and L4.
IMPRESSION: Metallic probe tips are posterior to the midportion of the L4
vertebral body and the L4-5 interspace levels. No fracture or
spondylolisthesis. Areas of arthropathy, stable.

## 2016-12-25 DIAGNOSIS — M25561 Pain in right knee: Secondary | ICD-10-CM | POA: Diagnosis not present

## 2016-12-25 DIAGNOSIS — M25562 Pain in left knee: Secondary | ICD-10-CM | POA: Diagnosis not present

## 2016-12-25 DIAGNOSIS — M170 Bilateral primary osteoarthritis of knee: Secondary | ICD-10-CM | POA: Diagnosis not present

## 2016-12-25 DIAGNOSIS — M25551 Pain in right hip: Secondary | ICD-10-CM | POA: Diagnosis not present

## 2016-12-25 DIAGNOSIS — M1611 Unilateral primary osteoarthritis, right hip: Secondary | ICD-10-CM | POA: Diagnosis not present

## 2017-01-03 DIAGNOSIS — M1611 Unilateral primary osteoarthritis, right hip: Secondary | ICD-10-CM | POA: Diagnosis not present

## 2017-05-28 DIAGNOSIS — M47812 Spondylosis without myelopathy or radiculopathy, cervical region: Secondary | ICD-10-CM | POA: Diagnosis not present

## 2017-05-28 DIAGNOSIS — M9901 Segmental and somatic dysfunction of cervical region: Secondary | ICD-10-CM | POA: Diagnosis not present

## 2017-05-28 DIAGNOSIS — M546 Pain in thoracic spine: Secondary | ICD-10-CM | POA: Diagnosis not present

## 2017-05-28 DIAGNOSIS — M9903 Segmental and somatic dysfunction of lumbar region: Secondary | ICD-10-CM | POA: Diagnosis not present

## 2017-05-28 DIAGNOSIS — M9902 Segmental and somatic dysfunction of thoracic region: Secondary | ICD-10-CM | POA: Diagnosis not present

## 2017-05-28 DIAGNOSIS — M47816 Spondylosis without myelopathy or radiculopathy, lumbar region: Secondary | ICD-10-CM | POA: Diagnosis not present

## 2017-06-04 DIAGNOSIS — M47812 Spondylosis without myelopathy or radiculopathy, cervical region: Secondary | ICD-10-CM | POA: Diagnosis not present

## 2017-06-04 DIAGNOSIS — M47816 Spondylosis without myelopathy or radiculopathy, lumbar region: Secondary | ICD-10-CM | POA: Diagnosis not present

## 2017-06-04 DIAGNOSIS — M546 Pain in thoracic spine: Secondary | ICD-10-CM | POA: Diagnosis not present

## 2017-06-04 DIAGNOSIS — M9901 Segmental and somatic dysfunction of cervical region: Secondary | ICD-10-CM | POA: Diagnosis not present

## 2017-06-04 DIAGNOSIS — M9903 Segmental and somatic dysfunction of lumbar region: Secondary | ICD-10-CM | POA: Diagnosis not present

## 2017-06-04 DIAGNOSIS — M9902 Segmental and somatic dysfunction of thoracic region: Secondary | ICD-10-CM | POA: Diagnosis not present

## 2017-06-11 DIAGNOSIS — M47816 Spondylosis without myelopathy or radiculopathy, lumbar region: Secondary | ICD-10-CM | POA: Diagnosis not present

## 2017-06-11 DIAGNOSIS — M9903 Segmental and somatic dysfunction of lumbar region: Secondary | ICD-10-CM | POA: Diagnosis not present

## 2017-06-11 DIAGNOSIS — M47812 Spondylosis without myelopathy or radiculopathy, cervical region: Secondary | ICD-10-CM | POA: Diagnosis not present

## 2017-06-11 DIAGNOSIS — M9901 Segmental and somatic dysfunction of cervical region: Secondary | ICD-10-CM | POA: Diagnosis not present

## 2017-06-11 DIAGNOSIS — M546 Pain in thoracic spine: Secondary | ICD-10-CM | POA: Diagnosis not present

## 2017-06-11 DIAGNOSIS — M9902 Segmental and somatic dysfunction of thoracic region: Secondary | ICD-10-CM | POA: Diagnosis not present

## 2017-06-13 DIAGNOSIS — M47812 Spondylosis without myelopathy or radiculopathy, cervical region: Secondary | ICD-10-CM | POA: Diagnosis not present

## 2017-06-13 DIAGNOSIS — M9901 Segmental and somatic dysfunction of cervical region: Secondary | ICD-10-CM | POA: Diagnosis not present

## 2017-06-13 DIAGNOSIS — M546 Pain in thoracic spine: Secondary | ICD-10-CM | POA: Diagnosis not present

## 2017-06-13 DIAGNOSIS — M9903 Segmental and somatic dysfunction of lumbar region: Secondary | ICD-10-CM | POA: Diagnosis not present

## 2017-06-13 DIAGNOSIS — M9902 Segmental and somatic dysfunction of thoracic region: Secondary | ICD-10-CM | POA: Diagnosis not present

## 2017-06-13 DIAGNOSIS — M47816 Spondylosis without myelopathy or radiculopathy, lumbar region: Secondary | ICD-10-CM | POA: Diagnosis not present

## 2017-06-16 DIAGNOSIS — M9902 Segmental and somatic dysfunction of thoracic region: Secondary | ICD-10-CM | POA: Diagnosis not present

## 2017-06-16 DIAGNOSIS — M9901 Segmental and somatic dysfunction of cervical region: Secondary | ICD-10-CM | POA: Diagnosis not present

## 2017-06-16 DIAGNOSIS — M47816 Spondylosis without myelopathy or radiculopathy, lumbar region: Secondary | ICD-10-CM | POA: Diagnosis not present

## 2017-06-16 DIAGNOSIS — M546 Pain in thoracic spine: Secondary | ICD-10-CM | POA: Diagnosis not present

## 2017-06-16 DIAGNOSIS — M47812 Spondylosis without myelopathy or radiculopathy, cervical region: Secondary | ICD-10-CM | POA: Diagnosis not present

## 2017-06-16 DIAGNOSIS — M9903 Segmental and somatic dysfunction of lumbar region: Secondary | ICD-10-CM | POA: Diagnosis not present

## 2017-06-18 DIAGNOSIS — R030 Elevated blood-pressure reading, without diagnosis of hypertension: Secondary | ICD-10-CM | POA: Diagnosis not present

## 2017-06-18 DIAGNOSIS — M25511 Pain in right shoulder: Secondary | ICD-10-CM | POA: Diagnosis not present

## 2017-06-18 DIAGNOSIS — M19011 Primary osteoarthritis, right shoulder: Secondary | ICD-10-CM | POA: Diagnosis not present

## 2017-06-18 DIAGNOSIS — M2550 Pain in unspecified joint: Secondary | ICD-10-CM | POA: Diagnosis not present

## 2017-06-18 DIAGNOSIS — Z299 Encounter for prophylactic measures, unspecified: Secondary | ICD-10-CM | POA: Diagnosis not present

## 2017-06-18 DIAGNOSIS — Z6832 Body mass index (BMI) 32.0-32.9, adult: Secondary | ICD-10-CM | POA: Diagnosis not present

## 2017-06-25 DIAGNOSIS — M25511 Pain in right shoulder: Secondary | ICD-10-CM | POA: Diagnosis not present

## 2017-06-25 DIAGNOSIS — M719 Bursopathy, unspecified: Secondary | ICD-10-CM | POA: Diagnosis not present

## 2017-06-25 DIAGNOSIS — M67919 Unspecified disorder of synovium and tendon, unspecified shoulder: Secondary | ICD-10-CM | POA: Diagnosis not present

## 2017-06-25 DIAGNOSIS — M19011 Primary osteoarthritis, right shoulder: Secondary | ICD-10-CM | POA: Diagnosis not present

## 2017-07-02 DIAGNOSIS — M9902 Segmental and somatic dysfunction of thoracic region: Secondary | ICD-10-CM | POA: Diagnosis not present

## 2017-07-02 DIAGNOSIS — M9901 Segmental and somatic dysfunction of cervical region: Secondary | ICD-10-CM | POA: Diagnosis not present

## 2017-07-02 DIAGNOSIS — M9903 Segmental and somatic dysfunction of lumbar region: Secondary | ICD-10-CM | POA: Diagnosis not present

## 2017-07-02 DIAGNOSIS — M47812 Spondylosis without myelopathy or radiculopathy, cervical region: Secondary | ICD-10-CM | POA: Diagnosis not present

## 2017-07-02 DIAGNOSIS — M47816 Spondylosis without myelopathy or radiculopathy, lumbar region: Secondary | ICD-10-CM | POA: Diagnosis not present

## 2017-07-02 DIAGNOSIS — M546 Pain in thoracic spine: Secondary | ICD-10-CM | POA: Diagnosis not present

## 2017-07-07 DIAGNOSIS — M9901 Segmental and somatic dysfunction of cervical region: Secondary | ICD-10-CM | POA: Diagnosis not present

## 2017-07-07 DIAGNOSIS — M47816 Spondylosis without myelopathy or radiculopathy, lumbar region: Secondary | ICD-10-CM | POA: Diagnosis not present

## 2017-07-07 DIAGNOSIS — M9903 Segmental and somatic dysfunction of lumbar region: Secondary | ICD-10-CM | POA: Diagnosis not present

## 2017-07-07 DIAGNOSIS — I10 Essential (primary) hypertension: Secondary | ICD-10-CM | POA: Diagnosis not present

## 2017-07-07 DIAGNOSIS — H2511 Age-related nuclear cataract, right eye: Secondary | ICD-10-CM | POA: Diagnosis not present

## 2017-07-07 DIAGNOSIS — H2513 Age-related nuclear cataract, bilateral: Secondary | ICD-10-CM | POA: Diagnosis not present

## 2017-07-07 DIAGNOSIS — M546 Pain in thoracic spine: Secondary | ICD-10-CM | POA: Diagnosis not present

## 2017-07-07 DIAGNOSIS — M47812 Spondylosis without myelopathy or radiculopathy, cervical region: Secondary | ICD-10-CM | POA: Diagnosis not present

## 2017-07-07 DIAGNOSIS — H25013 Cortical age-related cataract, bilateral: Secondary | ICD-10-CM | POA: Diagnosis not present

## 2017-07-07 DIAGNOSIS — H43393 Other vitreous opacities, bilateral: Secondary | ICD-10-CM | POA: Diagnosis not present

## 2017-07-07 DIAGNOSIS — M9902 Segmental and somatic dysfunction of thoracic region: Secondary | ICD-10-CM | POA: Diagnosis not present

## 2017-07-09 DIAGNOSIS — M9902 Segmental and somatic dysfunction of thoracic region: Secondary | ICD-10-CM | POA: Diagnosis not present

## 2017-07-09 DIAGNOSIS — M9901 Segmental and somatic dysfunction of cervical region: Secondary | ICD-10-CM | POA: Diagnosis not present

## 2017-07-09 DIAGNOSIS — M47816 Spondylosis without myelopathy or radiculopathy, lumbar region: Secondary | ICD-10-CM | POA: Diagnosis not present

## 2017-07-09 DIAGNOSIS — M546 Pain in thoracic spine: Secondary | ICD-10-CM | POA: Diagnosis not present

## 2017-07-09 DIAGNOSIS — M9903 Segmental and somatic dysfunction of lumbar region: Secondary | ICD-10-CM | POA: Diagnosis not present

## 2017-07-09 DIAGNOSIS — M47812 Spondylosis without myelopathy or radiculopathy, cervical region: Secondary | ICD-10-CM | POA: Diagnosis not present

## 2017-07-14 DIAGNOSIS — M47812 Spondylosis without myelopathy or radiculopathy, cervical region: Secondary | ICD-10-CM | POA: Diagnosis not present

## 2017-07-14 DIAGNOSIS — M9901 Segmental and somatic dysfunction of cervical region: Secondary | ICD-10-CM | POA: Diagnosis not present

## 2017-07-14 DIAGNOSIS — M9902 Segmental and somatic dysfunction of thoracic region: Secondary | ICD-10-CM | POA: Diagnosis not present

## 2017-07-14 DIAGNOSIS — M546 Pain in thoracic spine: Secondary | ICD-10-CM | POA: Diagnosis not present

## 2017-07-14 DIAGNOSIS — M47816 Spondylosis without myelopathy or radiculopathy, lumbar region: Secondary | ICD-10-CM | POA: Diagnosis not present

## 2017-07-14 DIAGNOSIS — M9903 Segmental and somatic dysfunction of lumbar region: Secondary | ICD-10-CM | POA: Diagnosis not present

## 2017-07-14 DIAGNOSIS — M531 Cervicobrachial syndrome: Secondary | ICD-10-CM | POA: Diagnosis not present

## 2017-07-16 DIAGNOSIS — M9901 Segmental and somatic dysfunction of cervical region: Secondary | ICD-10-CM | POA: Diagnosis not present

## 2017-07-16 DIAGNOSIS — M9902 Segmental and somatic dysfunction of thoracic region: Secondary | ICD-10-CM | POA: Diagnosis not present

## 2017-07-16 DIAGNOSIS — M47816 Spondylosis without myelopathy or radiculopathy, lumbar region: Secondary | ICD-10-CM | POA: Diagnosis not present

## 2017-07-16 DIAGNOSIS — M9903 Segmental and somatic dysfunction of lumbar region: Secondary | ICD-10-CM | POA: Diagnosis not present

## 2017-07-16 DIAGNOSIS — M47812 Spondylosis without myelopathy or radiculopathy, cervical region: Secondary | ICD-10-CM | POA: Diagnosis not present

## 2017-07-16 DIAGNOSIS — M546 Pain in thoracic spine: Secondary | ICD-10-CM | POA: Diagnosis not present

## 2017-07-16 DIAGNOSIS — M531 Cervicobrachial syndrome: Secondary | ICD-10-CM | POA: Diagnosis not present

## 2017-07-23 DIAGNOSIS — M47812 Spondylosis without myelopathy or radiculopathy, cervical region: Secondary | ICD-10-CM | POA: Diagnosis not present

## 2017-07-23 DIAGNOSIS — M47816 Spondylosis without myelopathy or radiculopathy, lumbar region: Secondary | ICD-10-CM | POA: Diagnosis not present

## 2017-07-23 DIAGNOSIS — M546 Pain in thoracic spine: Secondary | ICD-10-CM | POA: Diagnosis not present

## 2017-07-23 DIAGNOSIS — M9902 Segmental and somatic dysfunction of thoracic region: Secondary | ICD-10-CM | POA: Diagnosis not present

## 2017-07-23 DIAGNOSIS — M9903 Segmental and somatic dysfunction of lumbar region: Secondary | ICD-10-CM | POA: Diagnosis not present

## 2017-07-23 DIAGNOSIS — M9901 Segmental and somatic dysfunction of cervical region: Secondary | ICD-10-CM | POA: Diagnosis not present

## 2017-07-23 DIAGNOSIS — M531 Cervicobrachial syndrome: Secondary | ICD-10-CM | POA: Diagnosis not present

## 2017-07-30 DIAGNOSIS — M531 Cervicobrachial syndrome: Secondary | ICD-10-CM | POA: Diagnosis not present

## 2017-07-30 DIAGNOSIS — M9903 Segmental and somatic dysfunction of lumbar region: Secondary | ICD-10-CM | POA: Diagnosis not present

## 2017-07-30 DIAGNOSIS — M47812 Spondylosis without myelopathy or radiculopathy, cervical region: Secondary | ICD-10-CM | POA: Diagnosis not present

## 2017-07-30 DIAGNOSIS — M9901 Segmental and somatic dysfunction of cervical region: Secondary | ICD-10-CM | POA: Diagnosis not present

## 2017-07-30 DIAGNOSIS — M9902 Segmental and somatic dysfunction of thoracic region: Secondary | ICD-10-CM | POA: Diagnosis not present

## 2017-07-30 DIAGNOSIS — M546 Pain in thoracic spine: Secondary | ICD-10-CM | POA: Diagnosis not present

## 2017-07-30 DIAGNOSIS — M47816 Spondylosis without myelopathy or radiculopathy, lumbar region: Secondary | ICD-10-CM | POA: Diagnosis not present

## 2017-08-13 DIAGNOSIS — M9901 Segmental and somatic dysfunction of cervical region: Secondary | ICD-10-CM | POA: Diagnosis not present

## 2017-08-13 DIAGNOSIS — M9903 Segmental and somatic dysfunction of lumbar region: Secondary | ICD-10-CM | POA: Diagnosis not present

## 2017-08-13 DIAGNOSIS — M546 Pain in thoracic spine: Secondary | ICD-10-CM | POA: Diagnosis not present

## 2017-08-13 DIAGNOSIS — M531 Cervicobrachial syndrome: Secondary | ICD-10-CM | POA: Diagnosis not present

## 2017-08-13 DIAGNOSIS — M47816 Spondylosis without myelopathy or radiculopathy, lumbar region: Secondary | ICD-10-CM | POA: Diagnosis not present

## 2017-08-13 DIAGNOSIS — M47812 Spondylosis without myelopathy or radiculopathy, cervical region: Secondary | ICD-10-CM | POA: Diagnosis not present

## 2017-08-13 DIAGNOSIS — M9902 Segmental and somatic dysfunction of thoracic region: Secondary | ICD-10-CM | POA: Diagnosis not present

## 2017-08-26 DIAGNOSIS — H25811 Combined forms of age-related cataract, right eye: Secondary | ICD-10-CM | POA: Diagnosis not present

## 2017-08-26 DIAGNOSIS — H2511 Age-related nuclear cataract, right eye: Secondary | ICD-10-CM | POA: Diagnosis not present

## 2017-08-27 DIAGNOSIS — H2512 Age-related nuclear cataract, left eye: Secondary | ICD-10-CM | POA: Diagnosis not present

## 2017-08-27 DIAGNOSIS — M47812 Spondylosis without myelopathy or radiculopathy, cervical region: Secondary | ICD-10-CM | POA: Diagnosis not present

## 2017-08-27 DIAGNOSIS — M47816 Spondylosis without myelopathy or radiculopathy, lumbar region: Secondary | ICD-10-CM | POA: Diagnosis not present

## 2017-08-27 DIAGNOSIS — M9902 Segmental and somatic dysfunction of thoracic region: Secondary | ICD-10-CM | POA: Diagnosis not present

## 2017-08-27 DIAGNOSIS — M546 Pain in thoracic spine: Secondary | ICD-10-CM | POA: Diagnosis not present

## 2017-08-27 DIAGNOSIS — M531 Cervicobrachial syndrome: Secondary | ICD-10-CM | POA: Diagnosis not present

## 2017-08-27 DIAGNOSIS — M9903 Segmental and somatic dysfunction of lumbar region: Secondary | ICD-10-CM | POA: Diagnosis not present

## 2017-08-27 DIAGNOSIS — M9901 Segmental and somatic dysfunction of cervical region: Secondary | ICD-10-CM | POA: Diagnosis not present

## 2017-09-09 DIAGNOSIS — H2512 Age-related nuclear cataract, left eye: Secondary | ICD-10-CM | POA: Diagnosis not present

## 2017-09-09 DIAGNOSIS — H25812 Combined forms of age-related cataract, left eye: Secondary | ICD-10-CM | POA: Diagnosis not present

## 2017-10-03 DIAGNOSIS — Z713 Dietary counseling and surveillance: Secondary | ICD-10-CM | POA: Diagnosis not present

## 2017-10-03 DIAGNOSIS — Z6832 Body mass index (BMI) 32.0-32.9, adult: Secondary | ICD-10-CM | POA: Diagnosis not present

## 2017-10-03 DIAGNOSIS — Z2821 Immunization not carried out because of patient refusal: Secondary | ICD-10-CM | POA: Diagnosis not present

## 2017-10-03 DIAGNOSIS — Z789 Other specified health status: Secondary | ICD-10-CM | POA: Diagnosis not present

## 2017-10-03 DIAGNOSIS — J069 Acute upper respiratory infection, unspecified: Secondary | ICD-10-CM | POA: Diagnosis not present

## 2017-10-03 DIAGNOSIS — Z299 Encounter for prophylactic measures, unspecified: Secondary | ICD-10-CM | POA: Diagnosis not present

## 2018-01-01 DIAGNOSIS — Z299 Encounter for prophylactic measures, unspecified: Secondary | ICD-10-CM | POA: Diagnosis not present

## 2018-01-01 DIAGNOSIS — M7060 Trochanteric bursitis, unspecified hip: Secondary | ICD-10-CM | POA: Diagnosis not present

## 2018-01-01 DIAGNOSIS — Z6832 Body mass index (BMI) 32.0-32.9, adult: Secondary | ICD-10-CM | POA: Diagnosis not present

## 2018-01-01 DIAGNOSIS — Z713 Dietary counseling and surveillance: Secondary | ICD-10-CM | POA: Diagnosis not present

## 2018-01-01 DIAGNOSIS — I10 Essential (primary) hypertension: Secondary | ICD-10-CM | POA: Diagnosis not present

## 2018-01-21 DIAGNOSIS — H26491 Other secondary cataract, right eye: Secondary | ICD-10-CM | POA: Diagnosis not present

## 2018-03-04 DIAGNOSIS — M79671 Pain in right foot: Secondary | ICD-10-CM | POA: Diagnosis not present

## 2018-03-04 DIAGNOSIS — M722 Plantar fascial fibromatosis: Secondary | ICD-10-CM | POA: Diagnosis not present

## 2018-04-07 DIAGNOSIS — E7800 Pure hypercholesterolemia, unspecified: Secondary | ICD-10-CM | POA: Diagnosis not present

## 2018-04-07 DIAGNOSIS — Z1331 Encounter for screening for depression: Secondary | ICD-10-CM | POA: Diagnosis not present

## 2018-04-07 DIAGNOSIS — I10 Essential (primary) hypertension: Secondary | ICD-10-CM | POA: Diagnosis not present

## 2018-04-07 DIAGNOSIS — Z1211 Encounter for screening for malignant neoplasm of colon: Secondary | ICD-10-CM | POA: Diagnosis not present

## 2018-04-07 DIAGNOSIS — Z1339 Encounter for screening examination for other mental health and behavioral disorders: Secondary | ICD-10-CM | POA: Diagnosis not present

## 2018-04-07 DIAGNOSIS — M25579 Pain in unspecified ankle and joints of unspecified foot: Secondary | ICD-10-CM | POA: Diagnosis not present

## 2018-04-07 DIAGNOSIS — Z0000 Encounter for general adult medical examination without abnormal findings: Secondary | ICD-10-CM | POA: Diagnosis not present

## 2018-04-07 DIAGNOSIS — M722 Plantar fascial fibromatosis: Secondary | ICD-10-CM | POA: Diagnosis not present

## 2018-04-07 DIAGNOSIS — Z79899 Other long term (current) drug therapy: Secondary | ICD-10-CM | POA: Diagnosis not present

## 2018-04-07 DIAGNOSIS — Z6832 Body mass index (BMI) 32.0-32.9, adult: Secondary | ICD-10-CM | POA: Diagnosis not present

## 2018-04-07 DIAGNOSIS — R5383 Other fatigue: Secondary | ICD-10-CM | POA: Diagnosis not present

## 2018-04-07 DIAGNOSIS — M79672 Pain in left foot: Secondary | ICD-10-CM | POA: Diagnosis not present

## 2018-04-07 DIAGNOSIS — Z125 Encounter for screening for malignant neoplasm of prostate: Secondary | ICD-10-CM | POA: Diagnosis not present

## 2018-04-07 DIAGNOSIS — Z7189 Other specified counseling: Secondary | ICD-10-CM | POA: Diagnosis not present

## 2018-04-07 DIAGNOSIS — Z299 Encounter for prophylactic measures, unspecified: Secondary | ICD-10-CM | POA: Diagnosis not present

## 2018-04-10 DIAGNOSIS — Z6832 Body mass index (BMI) 32.0-32.9, adult: Secondary | ICD-10-CM | POA: Diagnosis not present

## 2018-04-10 DIAGNOSIS — Z713 Dietary counseling and surveillance: Secondary | ICD-10-CM | POA: Diagnosis not present

## 2018-04-10 DIAGNOSIS — I10 Essential (primary) hypertension: Secondary | ICD-10-CM | POA: Diagnosis not present

## 2018-04-10 DIAGNOSIS — Z299 Encounter for prophylactic measures, unspecified: Secondary | ICD-10-CM | POA: Diagnosis not present

## 2018-04-10 DIAGNOSIS — R972 Elevated prostate specific antigen [PSA]: Secondary | ICD-10-CM | POA: Diagnosis not present

## 2019-07-12 DIAGNOSIS — R350 Frequency of micturition: Secondary | ICD-10-CM | POA: Diagnosis not present

## 2019-07-15 DIAGNOSIS — M531 Cervicobrachial syndrome: Secondary | ICD-10-CM | POA: Diagnosis not present

## 2019-07-15 DIAGNOSIS — M9902 Segmental and somatic dysfunction of thoracic region: Secondary | ICD-10-CM | POA: Diagnosis not present

## 2019-07-15 DIAGNOSIS — M47816 Spondylosis without myelopathy or radiculopathy, lumbar region: Secondary | ICD-10-CM | POA: Diagnosis not present

## 2019-07-15 DIAGNOSIS — M546 Pain in thoracic spine: Secondary | ICD-10-CM | POA: Diagnosis not present

## 2019-07-15 DIAGNOSIS — M9903 Segmental and somatic dysfunction of lumbar region: Secondary | ICD-10-CM | POA: Diagnosis not present

## 2019-07-15 DIAGNOSIS — M47812 Spondylosis without myelopathy or radiculopathy, cervical region: Secondary | ICD-10-CM | POA: Diagnosis not present

## 2019-07-15 DIAGNOSIS — M9901 Segmental and somatic dysfunction of cervical region: Secondary | ICD-10-CM | POA: Diagnosis not present

## 2019-07-19 DIAGNOSIS — M531 Cervicobrachial syndrome: Secondary | ICD-10-CM | POA: Diagnosis not present

## 2019-07-19 DIAGNOSIS — M9903 Segmental and somatic dysfunction of lumbar region: Secondary | ICD-10-CM | POA: Diagnosis not present

## 2019-07-19 DIAGNOSIS — M47812 Spondylosis without myelopathy or radiculopathy, cervical region: Secondary | ICD-10-CM | POA: Diagnosis not present

## 2019-07-19 DIAGNOSIS — M9902 Segmental and somatic dysfunction of thoracic region: Secondary | ICD-10-CM | POA: Diagnosis not present

## 2019-07-19 DIAGNOSIS — M546 Pain in thoracic spine: Secondary | ICD-10-CM | POA: Diagnosis not present

## 2019-07-19 DIAGNOSIS — M47816 Spondylosis without myelopathy or radiculopathy, lumbar region: Secondary | ICD-10-CM | POA: Diagnosis not present

## 2019-07-19 DIAGNOSIS — M9901 Segmental and somatic dysfunction of cervical region: Secondary | ICD-10-CM | POA: Diagnosis not present

## 2019-07-22 DIAGNOSIS — M170 Bilateral primary osteoarthritis of knee: Secondary | ICD-10-CM | POA: Diagnosis not present

## 2019-07-22 DIAGNOSIS — M25461 Effusion, right knee: Secondary | ICD-10-CM | POA: Diagnosis not present

## 2019-12-17 DIAGNOSIS — M9901 Segmental and somatic dysfunction of cervical region: Secondary | ICD-10-CM | POA: Diagnosis not present

## 2019-12-17 DIAGNOSIS — M531 Cervicobrachial syndrome: Secondary | ICD-10-CM | POA: Diagnosis not present

## 2019-12-17 DIAGNOSIS — M9903 Segmental and somatic dysfunction of lumbar region: Secondary | ICD-10-CM | POA: Diagnosis not present

## 2019-12-17 DIAGNOSIS — M47812 Spondylosis without myelopathy or radiculopathy, cervical region: Secondary | ICD-10-CM | POA: Diagnosis not present

## 2019-12-17 DIAGNOSIS — M9902 Segmental and somatic dysfunction of thoracic region: Secondary | ICD-10-CM | POA: Diagnosis not present

## 2019-12-21 DIAGNOSIS — M9902 Segmental and somatic dysfunction of thoracic region: Secondary | ICD-10-CM | POA: Diagnosis not present

## 2019-12-21 DIAGNOSIS — M47812 Spondylosis without myelopathy or radiculopathy, cervical region: Secondary | ICD-10-CM | POA: Diagnosis not present

## 2019-12-21 DIAGNOSIS — M9901 Segmental and somatic dysfunction of cervical region: Secondary | ICD-10-CM | POA: Diagnosis not present

## 2019-12-21 DIAGNOSIS — M531 Cervicobrachial syndrome: Secondary | ICD-10-CM | POA: Diagnosis not present

## 2019-12-21 DIAGNOSIS — M9903 Segmental and somatic dysfunction of lumbar region: Secondary | ICD-10-CM | POA: Diagnosis not present

## 2019-12-28 DIAGNOSIS — M9902 Segmental and somatic dysfunction of thoracic region: Secondary | ICD-10-CM | POA: Diagnosis not present

## 2019-12-28 DIAGNOSIS — M9903 Segmental and somatic dysfunction of lumbar region: Secondary | ICD-10-CM | POA: Diagnosis not present

## 2019-12-28 DIAGNOSIS — M47812 Spondylosis without myelopathy or radiculopathy, cervical region: Secondary | ICD-10-CM | POA: Diagnosis not present

## 2019-12-28 DIAGNOSIS — M531 Cervicobrachial syndrome: Secondary | ICD-10-CM | POA: Diagnosis not present

## 2019-12-28 DIAGNOSIS — M9901 Segmental and somatic dysfunction of cervical region: Secondary | ICD-10-CM | POA: Diagnosis not present

## 2019-12-29 DIAGNOSIS — M170 Bilateral primary osteoarthritis of knee: Secondary | ICD-10-CM | POA: Diagnosis not present

## 2019-12-29 DIAGNOSIS — M25461 Effusion, right knee: Secondary | ICD-10-CM | POA: Diagnosis not present

## 2020-01-04 DIAGNOSIS — M47812 Spondylosis without myelopathy or radiculopathy, cervical region: Secondary | ICD-10-CM | POA: Diagnosis not present

## 2020-01-04 DIAGNOSIS — M9902 Segmental and somatic dysfunction of thoracic region: Secondary | ICD-10-CM | POA: Diagnosis not present

## 2020-01-04 DIAGNOSIS — M9901 Segmental and somatic dysfunction of cervical region: Secondary | ICD-10-CM | POA: Diagnosis not present

## 2020-01-04 DIAGNOSIS — M9903 Segmental and somatic dysfunction of lumbar region: Secondary | ICD-10-CM | POA: Diagnosis not present

## 2020-01-04 DIAGNOSIS — M531 Cervicobrachial syndrome: Secondary | ICD-10-CM | POA: Diagnosis not present

## 2020-01-05 DIAGNOSIS — M1712 Unilateral primary osteoarthritis, left knee: Secondary | ICD-10-CM | POA: Diagnosis not present

## 2020-01-05 DIAGNOSIS — Z299 Encounter for prophylactic measures, unspecified: Secondary | ICD-10-CM | POA: Diagnosis not present

## 2020-01-05 DIAGNOSIS — I10 Essential (primary) hypertension: Secondary | ICD-10-CM | POA: Diagnosis not present

## 2020-01-07 DIAGNOSIS — M1651 Unilateral post-traumatic osteoarthritis, right hip: Secondary | ICD-10-CM | POA: Diagnosis not present

## 2020-01-07 DIAGNOSIS — M170 Bilateral primary osteoarthritis of knee: Secondary | ICD-10-CM | POA: Diagnosis not present

## 2020-01-07 DIAGNOSIS — M7061 Trochanteric bursitis, right hip: Secondary | ICD-10-CM | POA: Diagnosis not present

## 2020-01-18 DIAGNOSIS — M531 Cervicobrachial syndrome: Secondary | ICD-10-CM | POA: Diagnosis not present

## 2020-01-18 DIAGNOSIS — M47812 Spondylosis without myelopathy or radiculopathy, cervical region: Secondary | ICD-10-CM | POA: Diagnosis not present

## 2020-01-18 DIAGNOSIS — M9903 Segmental and somatic dysfunction of lumbar region: Secondary | ICD-10-CM | POA: Diagnosis not present

## 2020-01-18 DIAGNOSIS — M9901 Segmental and somatic dysfunction of cervical region: Secondary | ICD-10-CM | POA: Diagnosis not present

## 2020-01-18 DIAGNOSIS — M9902 Segmental and somatic dysfunction of thoracic region: Secondary | ICD-10-CM | POA: Diagnosis not present

## 2020-01-21 DIAGNOSIS — M1611 Unilateral primary osteoarthritis, right hip: Secondary | ICD-10-CM | POA: Diagnosis not present

## 2020-01-21 DIAGNOSIS — M25551 Pain in right hip: Secondary | ICD-10-CM | POA: Diagnosis not present

## 2020-02-08 DIAGNOSIS — R5383 Other fatigue: Secondary | ICD-10-CM | POA: Diagnosis not present

## 2020-02-08 DIAGNOSIS — Z0000 Encounter for general adult medical examination without abnormal findings: Secondary | ICD-10-CM | POA: Diagnosis not present

## 2020-02-08 DIAGNOSIS — Z79899 Other long term (current) drug therapy: Secondary | ICD-10-CM | POA: Diagnosis not present

## 2020-02-08 DIAGNOSIS — Z299 Encounter for prophylactic measures, unspecified: Secondary | ICD-10-CM | POA: Diagnosis not present

## 2020-02-08 DIAGNOSIS — M1990 Unspecified osteoarthritis, unspecified site: Secondary | ICD-10-CM | POA: Diagnosis not present

## 2020-02-08 DIAGNOSIS — Z7189 Other specified counseling: Secondary | ICD-10-CM | POA: Diagnosis not present

## 2020-02-08 DIAGNOSIS — I10 Essential (primary) hypertension: Secondary | ICD-10-CM | POA: Diagnosis not present

## 2020-02-09 DIAGNOSIS — H6122 Impacted cerumen, left ear: Secondary | ICD-10-CM | POA: Diagnosis not present

## 2020-02-09 DIAGNOSIS — Z789 Other specified health status: Secondary | ICD-10-CM | POA: Diagnosis not present

## 2020-02-09 DIAGNOSIS — Z299 Encounter for prophylactic measures, unspecified: Secondary | ICD-10-CM | POA: Diagnosis not present

## 2020-02-09 DIAGNOSIS — I10 Essential (primary) hypertension: Secondary | ICD-10-CM | POA: Diagnosis not present

## 2020-12-01 DIAGNOSIS — R1011 Right upper quadrant pain: Secondary | ICD-10-CM | POA: Diagnosis not present

## 2020-12-01 DIAGNOSIS — R1031 Right lower quadrant pain: Secondary | ICD-10-CM | POA: Diagnosis not present

## 2020-12-05 DIAGNOSIS — Z28310 Unvaccinated for covid-19: Secondary | ICD-10-CM | POA: Diagnosis not present

## 2020-12-05 DIAGNOSIS — Z79899 Other long term (current) drug therapy: Secondary | ICD-10-CM | POA: Diagnosis not present

## 2020-12-05 DIAGNOSIS — M545 Low back pain, unspecified: Secondary | ICD-10-CM | POA: Diagnosis not present

## 2020-12-05 DIAGNOSIS — N200 Calculus of kidney: Secondary | ICD-10-CM | POA: Diagnosis not present

## 2020-12-12 DIAGNOSIS — M1651 Unilateral post-traumatic osteoarthritis, right hip: Secondary | ICD-10-CM | POA: Diagnosis not present

## 2020-12-12 DIAGNOSIS — M170 Bilateral primary osteoarthritis of knee: Secondary | ICD-10-CM | POA: Diagnosis not present

## 2020-12-21 ENCOUNTER — Other Ambulatory Visit (HOSPITAL_COMMUNITY): Payer: Self-pay | Admitting: Internal Medicine

## 2020-12-21 ENCOUNTER — Ambulatory Visit (HOSPITAL_COMMUNITY)
Admission: RE | Admit: 2020-12-21 | Discharge: 2020-12-21 | Disposition: A | Discharge status: Home / Self Care | Payer: Medicare Other | Source: Ambulatory Visit | Attending: Internal Medicine | Admitting: Internal Medicine

## 2020-12-21 ENCOUNTER — Other Ambulatory Visit: Payer: Self-pay

## 2020-12-21 DIAGNOSIS — R350 Frequency of micturition: Secondary | ICD-10-CM | POA: Diagnosis not present

## 2020-12-21 DIAGNOSIS — R109 Unspecified abdominal pain: Secondary | ICD-10-CM | POA: Diagnosis not present

## 2020-12-21 DIAGNOSIS — Z299 Encounter for prophylactic measures, unspecified: Secondary | ICD-10-CM | POA: Diagnosis not present

## 2020-12-21 DIAGNOSIS — I10 Essential (primary) hypertension: Secondary | ICD-10-CM | POA: Diagnosis not present

## 2020-12-21 DIAGNOSIS — K8020 Calculus of gallbladder without cholecystitis without obstruction: Secondary | ICD-10-CM | POA: Diagnosis not present

## 2020-12-21 DIAGNOSIS — K5750 Diverticulosis of both small and large intestine without perforation or abscess without bleeding: Secondary | ICD-10-CM | POA: Diagnosis not present

## 2020-12-21 DIAGNOSIS — N200 Calculus of kidney: Secondary | ICD-10-CM | POA: Diagnosis not present

## 2020-12-21 DIAGNOSIS — M25569 Pain in unspecified knee: Secondary | ICD-10-CM | POA: Diagnosis not present

## 2020-12-21 DIAGNOSIS — K8080 Other cholelithiasis without obstruction: Secondary | ICD-10-CM | POA: Diagnosis not present

## 2020-12-21 DIAGNOSIS — Z789 Other specified health status: Secondary | ICD-10-CM | POA: Diagnosis not present

## 2020-12-22 DIAGNOSIS — K8020 Calculus of gallbladder without cholecystitis without obstruction: Secondary | ICD-10-CM | POA: Diagnosis not present

## 2020-12-22 DIAGNOSIS — I700 Atherosclerosis of aorta: Secondary | ICD-10-CM | POA: Diagnosis not present

## 2020-12-22 DIAGNOSIS — M1990 Unspecified osteoarthritis, unspecified site: Secondary | ICD-10-CM | POA: Diagnosis not present

## 2020-12-22 DIAGNOSIS — M1611 Unilateral primary osteoarthritis, right hip: Secondary | ICD-10-CM | POA: Diagnosis not present

## 2020-12-22 DIAGNOSIS — Z299 Encounter for prophylactic measures, unspecified: Secondary | ICD-10-CM | POA: Diagnosis not present

## 2020-12-22 DIAGNOSIS — M25551 Pain in right hip: Secondary | ICD-10-CM | POA: Diagnosis not present

## 2020-12-22 DIAGNOSIS — N200 Calculus of kidney: Secondary | ICD-10-CM | POA: Diagnosis not present

## 2020-12-25 DIAGNOSIS — Z299 Encounter for prophylactic measures, unspecified: Secondary | ICD-10-CM | POA: Diagnosis not present

## 2020-12-25 DIAGNOSIS — Z713 Dietary counseling and surveillance: Secondary | ICD-10-CM | POA: Diagnosis not present

## 2020-12-25 DIAGNOSIS — J069 Acute upper respiratory infection, unspecified: Secondary | ICD-10-CM | POA: Diagnosis not present

## 2021-01-22 DIAGNOSIS — I10 Essential (primary) hypertension: Secondary | ICD-10-CM | POA: Diagnosis not present

## 2021-01-22 DIAGNOSIS — M1712 Unilateral primary osteoarthritis, left knee: Secondary | ICD-10-CM | POA: Diagnosis not present

## 2021-01-22 DIAGNOSIS — Z299 Encounter for prophylactic measures, unspecified: Secondary | ICD-10-CM | POA: Diagnosis not present

## 2021-01-22 DIAGNOSIS — R030 Elevated blood-pressure reading, without diagnosis of hypertension: Secondary | ICD-10-CM | POA: Diagnosis not present

## 2021-01-22 DIAGNOSIS — I700 Atherosclerosis of aorta: Secondary | ICD-10-CM | POA: Diagnosis not present

## 2021-04-11 DIAGNOSIS — M170 Bilateral primary osteoarthritis of knee: Secondary | ICD-10-CM | POA: Diagnosis not present

## 2021-04-11 DIAGNOSIS — M1651 Unilateral post-traumatic osteoarthritis, right hip: Secondary | ICD-10-CM | POA: Diagnosis not present

## 2021-04-27 DIAGNOSIS — M25551 Pain in right hip: Secondary | ICD-10-CM | POA: Diagnosis not present

## 2021-04-27 DIAGNOSIS — G8929 Other chronic pain: Secondary | ICD-10-CM | POA: Diagnosis not present

## 2021-05-09 DIAGNOSIS — M9901 Segmental and somatic dysfunction of cervical region: Secondary | ICD-10-CM | POA: Diagnosis not present

## 2021-05-09 DIAGNOSIS — M9903 Segmental and somatic dysfunction of lumbar region: Secondary | ICD-10-CM | POA: Diagnosis not present

## 2021-05-09 DIAGNOSIS — M9902 Segmental and somatic dysfunction of thoracic region: Secondary | ICD-10-CM | POA: Diagnosis not present

## 2021-05-09 DIAGNOSIS — S233XXA Sprain of ligaments of thoracic spine, initial encounter: Secondary | ICD-10-CM | POA: Diagnosis not present

## 2021-05-09 DIAGNOSIS — M47816 Spondylosis without myelopathy or radiculopathy, lumbar region: Secondary | ICD-10-CM | POA: Diagnosis not present

## 2021-05-09 DIAGNOSIS — M47812 Spondylosis without myelopathy or radiculopathy, cervical region: Secondary | ICD-10-CM | POA: Diagnosis not present

## 2021-05-16 DIAGNOSIS — R5383 Other fatigue: Secondary | ICD-10-CM | POA: Diagnosis not present

## 2021-05-16 DIAGNOSIS — Z299 Encounter for prophylactic measures, unspecified: Secondary | ICD-10-CM | POA: Diagnosis not present

## 2021-05-16 DIAGNOSIS — Z79899 Other long term (current) drug therapy: Secondary | ICD-10-CM | POA: Diagnosis not present

## 2021-05-16 DIAGNOSIS — I10 Essential (primary) hypertension: Secondary | ICD-10-CM | POA: Diagnosis not present

## 2021-05-16 DIAGNOSIS — Z7189 Other specified counseling: Secondary | ICD-10-CM | POA: Diagnosis not present

## 2021-05-16 DIAGNOSIS — Z0000 Encounter for general adult medical examination without abnormal findings: Secondary | ICD-10-CM | POA: Diagnosis not present

## 2021-05-16 DIAGNOSIS — Z23 Encounter for immunization: Secondary | ICD-10-CM | POA: Diagnosis not present

## 2021-06-05 DIAGNOSIS — Z299 Encounter for prophylactic measures, unspecified: Secondary | ICD-10-CM | POA: Diagnosis not present

## 2021-06-05 DIAGNOSIS — K219 Gastro-esophageal reflux disease without esophagitis: Secondary | ICD-10-CM | POA: Diagnosis not present

## 2021-06-05 DIAGNOSIS — I10 Essential (primary) hypertension: Secondary | ICD-10-CM | POA: Diagnosis not present

## 2021-06-05 DIAGNOSIS — R350 Frequency of micturition: Secondary | ICD-10-CM | POA: Diagnosis not present

## 2021-06-05 DIAGNOSIS — R109 Unspecified abdominal pain: Secondary | ICD-10-CM | POA: Diagnosis not present

## 2021-06-07 DIAGNOSIS — M1712 Unilateral primary osteoarthritis, left knee: Secondary | ICD-10-CM | POA: Diagnosis not present

## 2021-06-07 DIAGNOSIS — M25551 Pain in right hip: Secondary | ICD-10-CM | POA: Diagnosis not present

## 2021-06-07 DIAGNOSIS — M1711 Unilateral primary osteoarthritis, right knee: Secondary | ICD-10-CM | POA: Diagnosis not present

## 2021-06-19 DIAGNOSIS — I10 Essential (primary) hypertension: Secondary | ICD-10-CM | POA: Diagnosis not present

## 2021-06-19 DIAGNOSIS — Z299 Encounter for prophylactic measures, unspecified: Secondary | ICD-10-CM | POA: Diagnosis not present

## 2021-06-19 DIAGNOSIS — N200 Calculus of kidney: Secondary | ICD-10-CM | POA: Diagnosis not present

## 2021-06-19 DIAGNOSIS — I700 Atherosclerosis of aorta: Secondary | ICD-10-CM | POA: Diagnosis not present

## 2021-06-19 DIAGNOSIS — K8020 Calculus of gallbladder without cholecystitis without obstruction: Secondary | ICD-10-CM | POA: Diagnosis not present

## 2021-06-21 DIAGNOSIS — M79672 Pain in left foot: Secondary | ICD-10-CM | POA: Diagnosis not present

## 2021-06-21 DIAGNOSIS — M79671 Pain in right foot: Secondary | ICD-10-CM | POA: Diagnosis not present

## 2021-06-21 DIAGNOSIS — M79674 Pain in right toe(s): Secondary | ICD-10-CM | POA: Diagnosis not present

## 2021-06-21 DIAGNOSIS — M79675 Pain in left toe(s): Secondary | ICD-10-CM | POA: Diagnosis not present

## 2021-06-21 DIAGNOSIS — M2042 Other hammer toe(s) (acquired), left foot: Secondary | ICD-10-CM | POA: Diagnosis not present

## 2021-06-21 DIAGNOSIS — L110 Acquired keratosis follicularis: Secondary | ICD-10-CM | POA: Diagnosis not present

## 2021-06-21 DIAGNOSIS — M2041 Other hammer toe(s) (acquired), right foot: Secondary | ICD-10-CM | POA: Diagnosis not present

## 2021-06-25 DIAGNOSIS — M170 Bilateral primary osteoarthritis of knee: Secondary | ICD-10-CM | POA: Diagnosis not present

## 2021-06-25 DIAGNOSIS — M1611 Unilateral primary osteoarthritis, right hip: Secondary | ICD-10-CM | POA: Diagnosis not present

## 2021-06-29 DIAGNOSIS — Z299 Encounter for prophylactic measures, unspecified: Secondary | ICD-10-CM | POA: Diagnosis not present

## 2021-06-29 DIAGNOSIS — Z713 Dietary counseling and surveillance: Secondary | ICD-10-CM | POA: Diagnosis not present

## 2021-06-29 DIAGNOSIS — M1610 Unilateral primary osteoarthritis, unspecified hip: Secondary | ICD-10-CM | POA: Diagnosis not present

## 2021-07-09 ENCOUNTER — Ambulatory Visit: Payer: Self-pay | Admitting: Student

## 2021-07-09 DIAGNOSIS — M1909 Primary osteoarthritis, other specified site: Secondary | ICD-10-CM

## 2021-07-09 DIAGNOSIS — Z01818 Encounter for other preprocedural examination: Secondary | ICD-10-CM

## 2021-07-30 ENCOUNTER — Ambulatory Visit: Payer: Self-pay | Admitting: Student

## 2021-07-30 NOTE — H&P (View-Only) (Signed)
TOTAL HIP ADMISSION H&P  Patient is admitted for right total hip arthroplasty.  Subjective:  Chief Complaint: right hip pain  HPI: Jim Murray, 80 y.o. male, has a history of pain and functional disability in the right hip(s) due to arthritis and patient has failed non-surgical conservative treatments for greater than 12 weeks to include NSAID's and/or analgesics and activity modification.  Onset of symptoms was gradual starting 3 years ago with gradually worsening course since that time.The patient noted no past surgery on the right hip(s).  Patient currently rates pain in the right hip at 8 out of 10 with activity. Patient has worsening of pain with activity and weight bearing, pain that interfers with activities of daily living, and pain with passive range of motion. Patient has evidence of subchondral cysts, subchondral sclerosis, and joint space narrowing by imaging studies. This condition presents safety issues increasing the risk of falls.  There is no current active infection.  Patient Active Problem List   Diagnosis Date Noted   Spinal stenosis, lumbar region, with neurogenic claudication 06/14/2015   Past Medical History:  Diagnosis Date   History of kidney stones 2-3 weeks ago   done in [place]   Lumbar spinal stenosis    Pneumonia years ago   Swelling    right knee at times    Past Surgical History:  Procedure Laterality Date   cyst removed from right leg  8 months ago   left hand surgery for fracture  many years ago   LUMBAR LAMINECTOMY/DECOMPRESSION MICRODISCECTOMY Right 06/14/2015   Procedure: DECOMPRESSION LAMINECTOMY FOR SPINAL STENOSIS L4,5, MICRODISECTOMY L4,5 RIGHT FOR HERNIATED NUCLEUS PULPOSA;  Surgeon: Ranee Gosselin, MD;  Location: [facility];  Service: Orthopedics;  Laterality: Right;   surgery for kidney stones  3 weeks ago   surgery for right leg fracture  10 years ago    Current Outpatient Medications  Medication Sig Dispense Refill Last Dose    methocarbamol (ROBAXIN) 500 MG tablet Take 1 tablet (500 mg total) by mouth every 6 (six) hours as needed for muscle spasms. 40 tablet 1    oxyCODONE-acetaminophen (PERCOCET/ROXICET) 5-325 MG tablet Take 1-2 tablets by mouth every 4 (four) hours as needed for moderate pain. 80 tablet 0    tamsulosin (FLOMAX) 0.4 MG CAPS capsule Take 0.4 mg by mouth daily after supper.       UNABLE TO FIND Bee therapy  2 caps per day      No current facility-administered medications for this visit.   No Known Allergies  Social History   Tobacco Use   Smoking status: Never   Smokeless tobacco: Never  Substance Use Topics   Alcohol use: No    No family history on file.   Review of Systems  Musculoskeletal:  Positive for arthralgias.  All other systems reviewed and are negative.  Objective:  Physical Exam HENT:     Head: Normocephalic.  Eyes:     Pupils: Pupils are equal, round, and reactive to light.  Cardiovascular:     Rate and Rhythm: Normal rate.  Pulmonary:     Effort: Pulmonary effort is normal.  Abdominal:     Palpations: Abdomen is soft.  Genitourinary:    Comments: Deferred Musculoskeletal:     Cervical back: Normal range of motion.     Comments: Painful ROM R hip, R knee TTP  Skin:    General: Skin is warm.  Neurological:     Mental Status: He is alert and oriented to person, place, and  time.  Psychiatric:        Behavior: Behavior normal.    Vital signs in last 24 hours: @VSRANGES @  Labs:   Estimated body mass index is 32.22 kg/m as calculated from the following:   Height as of 06/14/15: 5\' 11"  (1.803 m).   Weight as of 06/14/15: 104.8 kg.   Imaging Review Plain radiographs demonstrate severe degenerative joint disease of the right hip(s). The bone quality appears to be adequate for age and reported activity level.      Assessment/Plan:  End stage arthritis, right hip(s)  The patient history, physical examination, clinical judgement of the provider and  imaging studies are consistent with end stage degenerative joint disease of the right hip(s) and total hip arthroplasty is deemed medically necessary. The treatment options including medical management, injection therapy, arthroscopy and arthroplasty were discussed at length. The risks and benefits of total hip arthroplasty were presented and reviewed. The risks due to aseptic loosening, infection, stiffness, dislocation/subluxation,  thromboembolic complications and other imponderables were discussed.  The patient acknowledged the explanation, agreed to proceed with the plan and consent was signed. Patient is being admitted for inpatient treatment for surgery, pain control, PT, OT, prophylactic antibiotics, VTE prophylaxis, progressive ambulation and ADL's and discharge planning.The patient is planning to be discharged  home same day

## 2021-07-30 NOTE — H&P (Signed)
TOTAL HIP ADMISSION H&P  Patient is admitted for right total hip arthroplasty.  Subjective:  Chief Complaint: right hip pain  HPI: Jim Murray, 80 y.o. male, has a history of pain and functional disability in the right hip(s) due to arthritis and patient has failed non-surgical conservative treatments for greater than 12 weeks to include NSAID's and/or analgesics and activity modification.  Onset of symptoms was gradual starting 3 years ago with gradually worsening course since that time.The patient noted no past surgery on the right hip(s).  Patient currently rates pain in the right hip at 8 out of 10 with activity. Patient has worsening of pain with activity and weight bearing, pain that interfers with activities of daily living, and pain with passive range of motion. Patient has evidence of subchondral cysts, subchondral sclerosis, and joint space narrowing by imaging studies. This condition presents safety issues increasing the risk of falls.  There is no current active infection.  Patient Active Problem List   Diagnosis Date Noted   Spinal stenosis, lumbar region, with neurogenic claudication 06/14/2015   Past Medical History:  Diagnosis Date   History of kidney stones 2-3 weeks ago   done in [place]   Lumbar spinal stenosis    Pneumonia years ago   Swelling    right knee at times    Past Surgical History:  Procedure Laterality Date   cyst removed from right leg  8 months ago   left hand surgery for fracture  many years ago   LUMBAR LAMINECTOMY/DECOMPRESSION MICRODISCECTOMY Right 06/14/2015   Procedure: DECOMPRESSION LAMINECTOMY FOR SPINAL STENOSIS L4,5, MICRODISECTOMY L4,5 RIGHT FOR HERNIATED NUCLEUS PULPOSA;  Surgeon: Ranee Gosselin, MD;  Location: [facility];  Service: Orthopedics;  Laterality: Right;   surgery for kidney stones  3 weeks ago   surgery for right leg fracture  10 years ago    Current Outpatient Medications  Medication Sig Dispense Refill Last Dose    methocarbamol (ROBAXIN) 500 MG tablet Take 1 tablet (500 mg total) by mouth every 6 (six) hours as needed for muscle spasms. 40 tablet 1    oxyCODONE-acetaminophen (PERCOCET/ROXICET) 5-325 MG tablet Take 1-2 tablets by mouth every 4 (four) hours as needed for moderate pain. 80 tablet 0    tamsulosin (FLOMAX) 0.4 MG CAPS capsule Take 0.4 mg by mouth daily after supper.       UNABLE TO FIND Bee therapy  2 caps per day      No current facility-administered medications for this visit.   No Known Allergies  Social History   Tobacco Use   Smoking status: Never   Smokeless tobacco: Never  Substance Use Topics   Alcohol use: No    No family history on file.   Review of Systems  Musculoskeletal:  Positive for arthralgias.  All other systems reviewed and are negative.  Objective:  Physical Exam HENT:     Head: Normocephalic.  Eyes:     Pupils: Pupils are equal, round, and reactive to light.  Cardiovascular:     Rate and Rhythm: Normal rate.  Pulmonary:     Effort: Pulmonary effort is normal.  Abdominal:     Palpations: Abdomen is soft.  Genitourinary:    Comments: Deferred Musculoskeletal:     Cervical back: Normal range of motion.     Comments: Painful ROM R hip, R knee TTP  Skin:    General: Skin is warm.  Neurological:     Mental Status: He is alert and oriented to person, place, and  time.  Psychiatric:        Behavior: Behavior normal.    Vital signs in last 24 hours: @VSRANGES @  Labs:   Estimated body mass index is 32.22 kg/m as calculated from the following:   Height as of 06/14/15: 5\' 11"  (1.803 m).   Weight as of 06/14/15: 104.8 kg.   Imaging Review Plain radiographs demonstrate severe degenerative joint disease of the right hip(s). The bone quality appears to be adequate for age and reported activity level.      Assessment/Plan:  End stage arthritis, right hip(s)  The patient history, physical examination, clinical judgement of the provider and  imaging studies are consistent with end stage degenerative joint disease of the right hip(s) and total hip arthroplasty is deemed medically necessary. The treatment options including medical management, injection therapy, arthroscopy and arthroplasty were discussed at length. The risks and benefits of total hip arthroplasty were presented and reviewed. The risks due to aseptic loosening, infection, stiffness, dislocation/subluxation,  thromboembolic complications and other imponderables were discussed.  The patient acknowledged the explanation, agreed to proceed with the plan and consent was signed. Patient is being admitted for inpatient treatment for surgery, pain control, PT, OT, prophylactic antibiotics, VTE prophylaxis, progressive ambulation and ADL's and discharge planning.The patient is planning to be discharged  home same day

## 2021-08-07 NOTE — Patient Instructions (Signed)
DUE TO COVID-19 ONLY ONE VISITOR IS ALLOWED TO COME WITH YOU AND STAY IN THE WAITING ROOM ONLY DURING PRE OP AND PROCEDURE.   **NO VISITORS ARE ALLOWED IN THE SHORT STAY AREA OR RECOVERY ROOM!!**  IF YOU WILL BE ADMITTED INTO THE HOSPITAL YOU ARE ALLOWED ONLY TWO SUPPORT PEOPLE DURING VISITATION HOURS ONLY (7 AM -8PM)   The support person(s) must pass our screening, gel in and out, and wear a mask at all times, including in the patients room. Patients must also wear a mask when staff or their support person are in the room. Visitors GUEST BADGE MUST BE WORN VISIBLY  One adult visitor may remain with you overnight and MUST be in the room by 8 P.M.  No visitors under the age of 29. Any visitor under the age of 13 must be accompanied by an adult.    Your procedure is scheduled on: 08/16/20   Report to [facility] Main Entrance    Report to short stay at: 5:15 AM   Call this number if you have problems the morning of surgery [phone]   Do not eat food :After Midnight.   May have liquids until:4:30 AM    day of surgery  CLEAR LIQUID DIET  Foods Allowed                                                                     Foods Excluded  Water, Black Coffee and tea, regular and decaf                             liquids that you cannot  Plain Jell-O in any flavor  (No red)                                           see through such as: Fruit ices (not with fruit pulp)                                     milk, soups, orange juice              Iced Popsicles (No red)                                    All solid food                                   Apple juices Sports drinks like Gatorade (No red) Lightly seasoned clear broth or consume(fat free) Sugar  Sample Menu Breakfast                                Lunch  Supper Cranberry juice                    Beef broth                            Chicken broth Jell-O                                      Grape juice                           Apple juice Coffee or tea                        Jell-O                                      Popsicle                                                Coffee or tea                        Coffee or tea      Complete one Ensure drink the morning of surgery at : 4:30 AM      the day of surgery.   The day of surgery:  Drink ONE (1) Pre-Surgery Clear Ensure or G2 by am the morning of surgery. Drink in one sitting. Do not sip.  This drink was given to you during your hospital  pre-op appointment visit. Nothing else to drink after completing the  Pre-Surgery Clear Ensure or G2.          If you have questions, please contact your surgeons office.     Oral Hygiene is also important to reduce your risk of infection.                                    Remember - BRUSH YOUR TEETH THE MORNING OF SURGERY WITH YOUR REGULAR TOOTHPASTE   Do NOT smoke after Midnight   Take these medicines the morning of surgery with A SIP OF WATER: N/A  DO NOT TAKE ANY ORAL DIABETIC MEDICATIONS DAY OF YOUR SURGERY                              You may not have any metal on your body including hair pins, jewelry, and body piercing             Do not wear lotions, powders, perfumes/cologne, or deodorant              Men may shave face and neck.   Do not bring valuables to the hospital. [facility] IS NOT             RESPONSIBLE   FOR VALUABLES.   Contacts, dentures or bridgework may not be worn into surgery.   Bring small overnight bag day of surgery.    Patients discharged on the  day of surgery will not be allowed to drive home.   Special Instructions: Bring a copy of your healthcare power of attorney and living will documents         the day of surgery if you haven't scanned them before.              Please read over the following fact sheets you were given: IF YOU HAVE QUESTIONS ABOUT YOUR PRE-OP INSTRUCTIONS PLEASE CALL [phone]     [facility] - Preparing for  Surgery Before surgery, you can play an important role.  Because skin is not sterile, your skin needs to be as free of germs as possible.  You can reduce the number of germs on your skin by washing with CHG (chlorahexidine gluconate) soap before surgery.  CHG is an antiseptic cleaner which kills germs and bonds with the skin to continue killing germs even after washing. Please DO NOT use if you have an allergy to CHG or antibacterial soaps.  If your skin becomes reddened/irritated stop using the CHG and inform your nurse when you arrive at Short Stay. Do not shave (including legs and underarms) for at least 48 hours prior to the first CHG shower.  You may shave your face/neck. Please follow these instructions carefully:  1.  Shower with CHG Soap the night before surgery and the  morning of Surgery.  2.  If you choose to wash your hair, wash your hair first as usual with your  normal  shampoo.  3.  After you shampoo, rinse your hair and body thoroughly to remove the  shampoo.                           4.  Use CHG as you would any other liquid soap.  You can apply chg directly  to the skin and wash                       Gently with a scrungie or clean washcloth.  5.  Apply the CHG Soap to your body ONLY FROM THE NECK DOWN.   Do not use on face/ open                           Wound or open sores. Avoid contact with eyes, ears mouth and genitals (private parts).                       Wash face,  Genitals (private parts) with your normal soap.             6.  Wash thoroughly, paying special attention to the area where your surgery  will be performed.  7.  Thoroughly rinse your body with warm water from the neck down.  8.  DO NOT shower/wash with your normal soap after using and rinsing off  the CHG Soap.                9.  Pat yourself dry with a clean towel.            10.  Wear clean pajamas.            11.  Place clean sheets on your bed the night of your first shower and do not  sleep with pets. Day  of Surgery : Do not apply any lotions/deodorants the morning of surgery.  Please wear clean  clothes to the hospital/surgery center.  FAILURE TO FOLLOW THESE INSTRUCTIONS MAY RESULT IN THE CANCELLATION OF YOUR SURGERY PATIENT SIGNATURE_________________________________  NURSE SIGNATURE__________________________________  ________________________________________________________________________   Jim Murray  An incentive spirometer is a tool that can help keep your lungs clear and active. This tool measures how well you are filling your lungs with each breath. Taking long deep breaths may help reverse or decrease the chance of developing breathing (pulmonary) problems (especially infection) following: A long period of time when you are unable to move or be active. BEFORE THE PROCEDURE  If the spirometer includes an indicator to show your best effort, your nurse or respiratory therapist will set it to a desired goal. If possible, sit up straight or lean slightly forward. Try not to slouch. Hold the incentive spirometer in an upright position. INSTRUCTIONS FOR USE  Sit on the edge of your bed if possible, or sit up as far as you can in bed or on a chair. Hold the incentive spirometer in an upright position. Breathe out normally. Place the mouthpiece in your mouth and seal your lips tightly around it. Breathe in slowly and as deeply as possible, raising the piston or the ball toward the top of the column. Hold your breath for 3-5 seconds or for as long as possible. Allow the piston or ball to fall to the bottom of the column. Remove the mouthpiece from your mouth and breathe out normally. Rest for a few seconds and repeat Steps 1 through 7 at least 10 times every 1-2 hours when you are awake. Take your time and take a few normal breaths between deep breaths. The spirometer may include an indicator to show your best effort. Use the indicator as a goal to work toward during each  repetition. After each set of 10 deep breaths, practice coughing to be sure your lungs are clear. If you have an incision (the cut made at the time of surgery), support your incision when coughing by placing a pillow or rolled up towels firmly against it. Once you are able to get out of bed, walk around indoors and cough well. You may stop using the incentive spirometer when instructed by your caregiver.  RISKS AND COMPLICATIONS Take your time so you do not get dizzy or light-headed. If you are in pain, you may need to take or ask for pain medication before doing incentive spirometry. It is harder to take a deep breath if you are having pain. AFTER USE Rest and breathe slowly and easily. It can be helpful to keep track of a log of your progress. Your caregiver can provide you with a simple table to help with this. If you are using the spirometer at home, follow these instructions: SEEK MEDICAL CARE IF:  You are having difficultly using the spirometer. You have trouble using the spirometer as often as instructed. Your pain medication is not giving enough relief while using the spirometer. You develop fever of 100.5 F (38.1 C) or higher. SEEK IMMEDIATE MEDICAL CARE IF:  You cough up bloody sputum that had not been present before. You develop fever of 102 F (38.9 C) or greater. You develop worsening pain at or near the incision site. MAKE SURE YOU:  Understand these instructions. Will watch your condition. Will get help right away if you are not doing well or get worse. Document Released: 12/09/2006 Document Revised: 10/21/2011 Document Reviewed: 02/09/2007 [facility] Patient Information 2014 [place], [place].   ________________________________________________________________________

## 2021-08-08 ENCOUNTER — Encounter (HOSPITAL_COMMUNITY): Payer: Self-pay

## 2021-08-08 ENCOUNTER — Other Ambulatory Visit: Payer: Self-pay

## 2021-08-08 ENCOUNTER — Encounter (HOSPITAL_COMMUNITY)
Admission: RE | Admit: 2021-08-08 | Discharge: 2021-08-08 | Disposition: A | Discharge status: Home / Self Care | Payer: Medicare Other | Source: Ambulatory Visit | Attending: Orthopedic Surgery | Admitting: Orthopedic Surgery

## 2021-08-08 VITALS — BP 142/74 | HR 60 | Temp 97.7°F | Resp 18 | Ht 71.0 in | Wt 235.2 lb

## 2021-08-08 DIAGNOSIS — M1909 Primary osteoarthritis, other specified site: Secondary | ICD-10-CM | POA: Diagnosis not present

## 2021-08-08 DIAGNOSIS — Z01818 Encounter for other preprocedural examination: Secondary | ICD-10-CM | POA: Insufficient documentation

## 2021-08-08 HISTORY — DX: Essential (primary) hypertension: I10

## 2021-08-08 HISTORY — DX: Unspecified osteoarthritis, unspecified site: M19.90

## 2021-08-08 LAB — CBC
HCT: 45.2 % (ref 39.0–52.0)
Hemoglobin: 14.8 g/dL (ref 13.0–17.0)
MCH: 30.5 pg (ref 26.0–34.0)
MCHC: 32.7 g/dL (ref 30.0–36.0)
MCV: 93 fL (ref 80.0–100.0)
Platelets: 182 10*3/uL (ref 150–400)
RBC: 4.86 MIL/uL (ref 4.22–5.81)
RDW: 12.7 % (ref 11.5–15.5)
WBC: 6.4 10*3/uL (ref 4.0–10.5)
nRBC: 0 % (ref 0.0–0.2)

## 2021-08-08 LAB — COMPREHENSIVE METABOLIC PANEL
ALT: 22 U/L (ref 0–44)
AST: 18 U/L (ref 15–41)
Albumin: 4.1 g/dL (ref 3.5–5.0)
Alkaline Phosphatase: 58 U/L (ref 38–126)
Anion gap: 6 (ref 5–15)
BUN: 19 mg/dL (ref 8–23)
CO2: 27 mmol/L (ref 22–32)
Calcium: 9.1 mg/dL (ref 8.9–10.3)
Chloride: 105 mmol/L (ref 98–111)
Creatinine, Ser: 0.75 mg/dL (ref 0.61–1.24)
GFR, Estimated: >60 mL/min (ref >60)
Glucose, Bld: 111 mg/dL — ABNORMAL HIGH (ref 70–99)
Potassium: 4.3 mmol/L (ref 3.5–5.1)
Sodium: 138 mmol/L (ref 135–145)
Total Bilirubin: 1 mg/dL (ref 0.3–1.2)
Total Protein: 7.5 g/dL (ref 6.5–8.1)

## 2021-08-08 LAB — SURGICAL PCR SCREEN
MRSA, PCR: NEGATIVE
Staphylococcus aureus: NEGATIVE

## 2021-08-08 LAB — PROTIME-INR
INR: 1 (ref 0.8–1.2)
Prothrombin Time: 13.4 seconds (ref 11.4–15.2)

## 2021-08-08 NOTE — Progress Notes (Signed)
COVID Vaccine Completed: Yes Date COVID Vaccine completed: unknown. Was done in [place]. X 2 COVID vaccine manufacturer: [employer]  COVID Test: N/A PCP - NO PCP Cardiologist -   Chest x-ray -  EKG -  Stress Test -  ECHO -  Cardiac Cath -  Pacemaker/ICD device last checked:  Sleep Study -  CPAP -   Fasting Blood Sugar -  Checks Blood Sugar _____ times a day  Blood Thinner Instructions: Aspirin Instructions: Last Dose:  Anesthesia review: Hx: HTN  Patient denies shortness of breath, fever, cough and chest pain at PAT appointment   Patient verbalized understanding of instructions that were given to them at the PAT appointment. Patient was also instructed that they will need to review over the PAT instructions again at home before surgery.

## 2021-08-15 NOTE — Anesthesia Preprocedure Evaluation (Addendum)
Anesthesia Evaluation  Patient identified by MRN, date of birth, ID band Patient awake    Reviewed: Allergy & Precautions, NPO status , Patient's Chart, lab work & pertinent test results  Airway Mallampati: II  TM Distance: >3 FB     Dental   Pulmonary    breath sounds clear to auscultation       Cardiovascular hypertension,  Rhythm:Regular Rate:Normal     Neuro/Psych    GI/Hepatic negative GI ROS, Neg liver ROS,   Endo/Other  negative endocrine ROS  Renal/GU negative Renal ROS     Musculoskeletal   Abdominal   Peds  Hematology   Anesthesia Other Findings   Reproductive/Obstetrics                            Anesthesia Physical Anesthesia Plan  ASA: 3  Anesthesia Plan: Spinal   Post-op Pain Management:    Induction: Intravenous  PONV Risk Score and Plan: Ondansetron, Midazolam and Propofol infusion  Airway Management Planned: Simple Face Mask  Additional Equipment:   Intra-op Plan:   Post-operative Plan:   Informed Consent: I have reviewed the patients History and Physical, chart, labs and discussed the procedure including the risks, benefits and alternatives for the proposed anesthesia with the patient or authorized representative who has indicated his/her understanding and acceptance.     Dental advisory given  Plan Discussed with: Anesthesiologist, CRNA and Surgeon  Anesthesia Plan Comments:       Anesthesia Quick Evaluation

## 2021-08-16 ENCOUNTER — Encounter (HOSPITAL_COMMUNITY): Payer: Self-pay | Admitting: Orthopedic Surgery

## 2021-08-16 ENCOUNTER — Ambulatory Visit (HOSPITAL_COMMUNITY): Payer: Medicare Other | Admitting: Physician Assistant

## 2021-08-16 ENCOUNTER — Ambulatory Visit (HOSPITAL_COMMUNITY)
Admission: RE | Admit: 2021-08-16 | Discharge: 2021-08-16 | Disposition: A | Discharge status: Home / Self Care | Payer: Medicare Other | Source: Ambulatory Visit | Attending: Orthopedic Surgery | Admitting: Orthopedic Surgery

## 2021-08-16 ENCOUNTER — Ambulatory Visit (HOSPITAL_COMMUNITY): Payer: Medicare Other

## 2021-08-16 ENCOUNTER — Ambulatory Visit (HOSPITAL_COMMUNITY): Payer: Medicare Other | Admitting: Anesthesiology

## 2021-08-16 ENCOUNTER — Encounter (HOSPITAL_COMMUNITY)
Admission: RE | Disposition: A | Discharge status: Home / Self Care | Payer: Self-pay | Source: Ambulatory Visit | Attending: Orthopedic Surgery

## 2021-08-16 DIAGNOSIS — Z09 Encounter for follow-up examination after completed treatment for conditions other than malignant neoplasm: Secondary | ICD-10-CM

## 2021-08-16 DIAGNOSIS — I10 Essential (primary) hypertension: Secondary | ICD-10-CM | POA: Insufficient documentation

## 2021-08-16 DIAGNOSIS — Z419 Encounter for procedure for purposes other than remedying health state, unspecified: Secondary | ICD-10-CM

## 2021-08-16 DIAGNOSIS — M1651 Unilateral post-traumatic osteoarthritis, right hip: Secondary | ICD-10-CM | POA: Diagnosis not present

## 2021-08-16 DIAGNOSIS — Z96641 Presence of right artificial hip joint: Secondary | ICD-10-CM | POA: Diagnosis not present

## 2021-08-16 DIAGNOSIS — M1611 Unilateral primary osteoarthritis, right hip: Secondary | ICD-10-CM

## 2021-08-16 DIAGNOSIS — Z471 Aftercare following joint replacement surgery: Secondary | ICD-10-CM | POA: Diagnosis not present

## 2021-08-16 HISTORY — PX: TOTAL HIP ARTHROPLASTY: SHX124

## 2021-08-16 LAB — ABO/RH: ABO/RH(D): O NEG

## 2021-08-16 LAB — TYPE AND SCREEN
ABO/RH(D): O NEG
Antibody Screen: NEGATIVE

## 2021-08-16 SURGERY — ARTHROPLASTY, HIP, TOTAL, ANTERIOR APPROACH
Anesthesia: Spinal | Site: Hip | Laterality: Right

## 2021-08-16 MED ORDER — PROPOFOL 1000 MG/100ML IV EMUL
INTRAVENOUS | Status: AC
  Filled 2021-08-16: qty 200

## 2021-08-16 MED ORDER — KETOROLAC TROMETHAMINE 30 MG/ML IJ SOLN
INTRAMUSCULAR | Status: AC
  Filled 2021-08-16: qty 1

## 2021-08-16 MED ORDER — EPHEDRINE 5 MG/ML INJ
INTRAVENOUS | Status: AC
  Filled 2021-08-16: qty 5

## 2021-08-16 MED ORDER — ONDANSETRON HCL 4 MG/2ML IJ SOLN
INTRAMUSCULAR | Status: DC | PRN
  Administered 2021-08-16: 4 mg via INTRAVENOUS

## 2021-08-16 MED ORDER — CEFAZOLIN SODIUM-DEXTROSE 2-4 GM/100ML-% IV SOLN
INTRAVENOUS | Status: AC
  Filled 2021-08-16: qty 100

## 2021-08-16 MED ORDER — SODIUM CHLORIDE 0.9 % IV SOLN: INTRAVENOUS | Status: DC

## 2021-08-16 MED ORDER — ONDANSETRON HCL 4 MG/2ML IJ SOLN
INTRAMUSCULAR | Status: AC
  Filled 2021-08-16: qty 2

## 2021-08-16 MED ORDER — SODIUM CHLORIDE (PF) 0.9 % IJ SOLN
INTRAMUSCULAR | Status: DC | PRN
  Administered 2021-08-16: 30 mL

## 2021-08-16 MED ORDER — ONDANSETRON HCL 4 MG PO TABS: 4.0000 mg | ORAL_TABLET | Freq: Three times a day (TID) | ORAL | 0 refills | Status: AC | PRN

## 2021-08-16 MED ORDER — PHENYLEPHRINE HCL (PRESSORS) 10 MG/ML IV SOLN
INTRAVENOUS | Status: AC
  Filled 2021-08-16: qty 1

## 2021-08-16 MED ORDER — BUPIVACAINE-EPINEPHRINE 0.25% -1:200000 IJ SOLN
INTRAMUSCULAR | Status: DC | PRN
  Administered 2021-08-16: 30 mL

## 2021-08-16 MED ORDER — LACTATED RINGERS IV BOLUS
250.0000 mL | Freq: Once | INTRAVENOUS | Status: AC
  Administered 2021-08-16: 250 mL via INTRAVENOUS

## 2021-08-16 MED ORDER — ASPIRIN 81 MG PO CHEW: 81.0000 mg | CHEWABLE_TABLET | Freq: Two times a day (BID) | ORAL | 0 refills | Status: AC

## 2021-08-16 MED ORDER — POVIDONE-IODINE 10 % EX SWAB: 2.0000 "application " | Freq: Once | CUTANEOUS | Status: DC

## 2021-08-16 MED ORDER — ORAL CARE MOUTH RINSE: 15.0000 mL | Freq: Once | OROMUCOSAL | Status: AC

## 2021-08-16 MED ORDER — BUPIVACAINE-EPINEPHRINE (PF) 0.25% -1:200000 IJ SOLN
INTRAMUSCULAR | Status: AC
  Filled 2021-08-16: qty 30

## 2021-08-16 MED ORDER — HYDROCODONE-ACETAMINOPHEN 5-325 MG PO TABS
1.0000 | ORAL_TABLET | ORAL | Status: DC | PRN
  Administered 2021-08-16: 2 via ORAL

## 2021-08-16 MED ORDER — CHLORHEXIDINE GLUCONATE 0.12 % MT SOLN
15.0000 mL | Freq: Once | OROMUCOSAL | Status: AC
  Administered 2021-08-16: 15 mL via OROMUCOSAL

## 2021-08-16 MED ORDER — DEXAMETHASONE SODIUM PHOSPHATE 10 MG/ML IJ SOLN
INTRAMUSCULAR | Status: AC
  Filled 2021-08-16: qty 1

## 2021-08-16 MED ORDER — PROPOFOL 10 MG/ML IV BOLUS
INTRAVENOUS | Status: DC | PRN
Start: 2021-08-16 — End: 2021-08-16
  Administered 2021-08-16: 40 mg via INTRAVENOUS
  Administered 2021-08-16: 120 ug/kg/min via INTRAVENOUS

## 2021-08-16 MED ORDER — FENTANYL CITRATE PF 50 MCG/ML IJ SOSY
PREFILLED_SYRINGE | INTRAMUSCULAR | Status: AC
  Filled 2021-08-16: qty 2

## 2021-08-16 MED ORDER — DEXAMETHASONE SODIUM PHOSPHATE 10 MG/ML IJ SOLN
INTRAMUSCULAR | Status: DC | PRN
  Administered 2021-08-16: 10 mg via INTRAVENOUS

## 2021-08-16 MED ORDER — POVIDONE-IODINE 10 % EX SWAB
2.0000 "application " | Freq: Once | CUTANEOUS | Status: AC
  Administered 2021-08-16: 2 via TOPICAL

## 2021-08-16 MED ORDER — ACETAMINOPHEN 10 MG/ML IV SOLN
1000.0000 mg | Freq: Once | INTRAVENOUS | Status: AC
  Administered 2021-08-16: 1000 mg via INTRAVENOUS
  Filled 2021-08-16: qty 100

## 2021-08-16 MED ORDER — DOCUSATE SODIUM 100 MG PO CAPS: 100.0000 mg | ORAL_CAPSULE | Freq: Two times a day (BID) | ORAL | 1 refills | Status: AC

## 2021-08-16 MED ORDER — LACTATED RINGERS IV SOLN
INTRAVENOUS | Status: DC
Start: 2021-08-16 — End: 2021-08-16

## 2021-08-16 MED ORDER — ISOPROPYL ALCOHOL 70 % SOLN
Status: DC | PRN
  Administered 2021-08-16: 1 via TOPICAL

## 2021-08-16 MED ORDER — SENNA 8.6 MG PO TABS: 2.0000 | ORAL_TABLET | Freq: Every day | ORAL | 1 refills | Status: AC

## 2021-08-16 MED ORDER — METHOCARBAMOL 500 MG IVPB - SIMPLE MED
INTRAVENOUS | Status: AC
  Filled 2021-08-16: qty 50

## 2021-08-16 MED ORDER — BUPIVACAINE IN DEXTROSE 0.75-8.25 % IT SOLN
INTRATHECAL | Status: DC | PRN
  Administered 2021-08-16: 2 mL via INTRATHECAL

## 2021-08-16 MED ORDER — HYDROCODONE-ACETAMINOPHEN 5-325 MG PO TABS: 1.0000 | ORAL_TABLET | ORAL | 0 refills | Status: AC | PRN

## 2021-08-16 MED ORDER — CEFAZOLIN SODIUM-DEXTROSE 2-4 GM/100ML-% IV SOLN
2.0000 g | Freq: Four times a day (QID) | INTRAVENOUS | Status: DC
  Administered 2021-08-16: 2 g via INTRAVENOUS

## 2021-08-16 MED ORDER — METHOCARBAMOL 500 MG PO TABS: 500.0000 mg | ORAL_TABLET | Freq: Four times a day (QID) | ORAL | Status: DC | PRN

## 2021-08-16 MED ORDER — METHOCARBAMOL 500 MG IVPB - SIMPLE MED
500.0000 mg | Freq: Four times a day (QID) | INTRAVENOUS | Status: DC | PRN
  Administered 2021-08-16: 500 mg via INTRAVENOUS

## 2021-08-16 MED ORDER — WATER FOR IRRIGATION, STERILE IR SOLN
Status: DC | PRN
  Administered 2021-08-16: 2000 mL

## 2021-08-16 MED ORDER — ISOPROPYL ALCOHOL 70 % SOLN
Status: AC
  Filled 2021-08-16: qty 480

## 2021-08-16 MED ORDER — HYDROCODONE-ACETAMINOPHEN 5-325 MG PO TABS
ORAL_TABLET | ORAL | Status: AC
  Filled 2021-08-16: qty 2

## 2021-08-16 MED ORDER — FENTANYL CITRATE PF 50 MCG/ML IJ SOSY
25.0000 ug | PREFILLED_SYRINGE | INTRAMUSCULAR | Status: DC | PRN
  Administered 2021-08-16 (×2): 50 ug via INTRAVENOUS

## 2021-08-16 MED ORDER — PHENYLEPHRINE HCL-NACL 20-0.9 MG/250ML-% IV SOLN
INTRAVENOUS | Status: DC | PRN
  Administered 2021-08-16: 25 ug/min via INTRAVENOUS

## 2021-08-16 MED ORDER — EPHEDRINE SULFATE-NACL 50-0.9 MG/10ML-% IV SOSY
PREFILLED_SYRINGE | INTRAVENOUS | Status: DC | PRN
  Administered 2021-08-16 (×2): 10 mg via INTRAVENOUS

## 2021-08-16 MED ORDER — SODIUM CHLORIDE 0.9 % IR SOLN
Status: DC | PRN
Start: 2021-08-16 — End: 2021-08-16
  Administered 2021-08-16: 1000 mL

## 2021-08-16 MED ORDER — KETOROLAC TROMETHAMINE 30 MG/ML IJ SOLN
INTRAMUSCULAR | Status: DC | PRN
  Administered 2021-08-16: 30 mg via INTRA_ARTICULAR

## 2021-08-16 MED ORDER — CEFAZOLIN SODIUM-DEXTROSE 2-4 GM/100ML-% IV SOLN
2.0000 g | INTRAVENOUS | Status: AC
  Administered 2021-08-16: 2 g via INTRAVENOUS
  Filled 2021-08-16: qty 100

## 2021-08-16 MED ORDER — TRANEXAMIC ACID-NACL 1000-0.7 MG/100ML-% IV SOLN
1000.0000 mg | INTRAVENOUS | Status: AC
  Administered 2021-08-16: 1000 mg via INTRAVENOUS
  Filled 2021-08-16: qty 100

## 2021-08-16 MED ORDER — LACTATED RINGERS IV BOLUS
500.0000 mL | Freq: Once | INTRAVENOUS | Status: AC
  Administered 2021-08-16: 500 mL via INTRAVENOUS

## 2021-08-16 SURGICAL SUPPLY — 58 items
BAG COUNTER SPONGE SURGICOUNT (BAG) IMPLANT
BAG DECANTER FOR FLEXI CONT (MISCELLANEOUS) IMPLANT
BAG ZIPLOCK 12X15 (MISCELLANEOUS) IMPLANT
CHLORAPREP W/TINT 26 (MISCELLANEOUS) ×2 IMPLANT
COVER PERINEAL POST (MISCELLANEOUS) ×2 IMPLANT
COVER SURGICAL LIGHT HANDLE (MISCELLANEOUS) ×2 IMPLANT
DECANTER SPIKE VIAL GLASS SM (MISCELLANEOUS) ×2 IMPLANT
DERMABOND ADVANCED (GAUZE/BANDAGES/DRESSINGS) ×1
DERMABOND ADVANCED .7 DNX12 (GAUZE/BANDAGES/DRESSINGS) ×2 IMPLANT
DRAPE IMP U-DRAPE 54X76 (DRAPES) ×2 IMPLANT
DRAPE SHEET LG 3/4 BI-LAMINATE (DRAPES) ×6 IMPLANT
DRAPE STERI IOBAN 125X83 (DRAPES) ×2 IMPLANT
DRAPE U-SHAPE 47X51 STRL (DRAPES) ×4 IMPLANT
DRSG AQUACEL AG ADV 3.5X10 (GAUZE/BANDAGES/DRESSINGS) ×2 IMPLANT
ELECT REM PT RETURN 15FT ADLT (MISCELLANEOUS) ×2 IMPLANT
G7 VIT E NTRL LNR 36 SZG (Miscellaneous) ×1 IMPLANT
GAUZE SPONGE 4X4 12PLY STRL (GAUZE/BANDAGES/DRESSINGS) ×2 IMPLANT
GLOVE SRG 8 PF TXTR STRL LF DI (GLOVE) ×1 IMPLANT
GLOVE SURG ENC MOIS LTX SZ8.5 (GLOVE) ×4 IMPLANT
GLOVE SURG ENC TEXT LTX SZ7.5 (GLOVE) ×4 IMPLANT
GLOVE SURG UNDER POLY LF SZ8 (GLOVE) ×1
GLOVE SURG UNDER POLY LF SZ8.5 (GLOVE) ×2 IMPLANT
GOWN SPEC L3 XXLG W/TWL (GOWN DISPOSABLE) ×2 IMPLANT
GOWN STRL REUS W/TWL XL LVL3 (GOWN DISPOSABLE) ×2 IMPLANT
HANDPIECE INTERPULSE COAX TIP (DISPOSABLE) ×1
HEAD FEM +3XOFST 36XMDLR (Orthopedic Implant) IMPLANT
HEAD MODULAR 36MM (Orthopedic Implant) ×1 IMPLANT
HOLDER FOLEY CATH W/STRAP (MISCELLANEOUS) ×2 IMPLANT
HOOD PEEL AWAY FLYTE STAYCOOL (MISCELLANEOUS) ×8 IMPLANT
JET LAVAGE IRRISEPT WOUND (IRRIGATION / IRRIGATOR) ×2
KIT TURNOVER KIT A (KITS) IMPLANT
LAVAGE JET IRRISEPT WOUND (IRRIGATION / IRRIGATOR) ×1 IMPLANT
MANIFOLD NEPTUNE II (INSTRUMENTS) ×2 IMPLANT
MARKER SKIN DUAL TIP RULER LAB (MISCELLANEOUS) ×2 IMPLANT
NDL SAFETY ECLIPSE 18X1.5 (NEEDLE) ×1 IMPLANT
NDL SPNL 18GX3.5 QUINCKE PK (NEEDLE) ×1 IMPLANT
NEEDLE HYPO 18GX1.5 SHARP (NEEDLE) ×1
NEEDLE SPNL 18GX3.5 QUINCKE PK (NEEDLE) ×2 IMPLANT
PACK ANTERIOR HIP CUSTOM (KITS) ×2 IMPLANT
PENCIL SMOKE EVACUATOR (MISCELLANEOUS) IMPLANT
SAW OSC TIP CART 19.5X105X1.3 (SAW) ×2 IMPLANT
SEALER BIPOLAR AQUA 6.0 (INSTRUMENTS) ×2 IMPLANT
SET HNDPC FAN SPRY TIP SCT (DISPOSABLE) ×1 IMPLANT
SHELL ACET G7 4H 58 SZG HIP (Shell) ×1 IMPLANT
SPONGE T-LAP 18X18 ~~LOC~~+RFID (SPONGE) ×6 IMPLANT
STAPLER INSORB 30 2030 C-SECTI (MISCELLANEOUS) IMPLANT
STEM FEM CL 119X39.6 SZ17 133D (Stem) ×1 IMPLANT
SUT MNCRL AB 3-0 PS2 18 (SUTURE) ×2 IMPLANT
SUT MON AB 2-0 CT1 36 (SUTURE) ×2 IMPLANT
SUT STRATAFIX PDO 1 14 VIOLET (SUTURE) ×1
SUT STRATFX PDO 1 14 VIOLET (SUTURE) ×1
SUT VIC AB 2-0 CT1 27 (SUTURE)
SUT VIC AB 2-0 CT1 TAPERPNT 27 (SUTURE) IMPLANT
SUTURE STRATFX PDO 1 14 VIOLET (SUTURE) ×1 IMPLANT
SYR 3ML LL SCALE MARK (SYRINGE) ×2 IMPLANT
TRAY FOLEY MTR SLVR 16FR STAT (SET/KITS/TRAYS/PACK) IMPLANT
TUBE SUCTION HIGH CAP CLEAR NV (SUCTIONS) ×2 IMPLANT
WATER STERILE IRR 1000ML POUR (IV SOLUTION) ×2 IMPLANT

## 2021-08-16 NOTE — Anesthesia Procedure Notes (Signed)
Spinal  Patient location during procedure: OR Start time: 08/16/2021 7:34 AM End time: 08/16/2021 7:36 AM Reason for block: surgical anesthesia Staffing Performed: resident/CRNA  Resident/CRNA: [place] Lanyiah Brix C, CRNA Preanesthetic Checklist Completed: patient identified, IV checked, site marked, risks and benefits discussed, surgical consent, monitors and equipment checked, pre-op evaluation and timeout performed Spinal Block Patient position: sitting Prep: DuraPrep and site prepped and draped Patient monitoring: heart rate, cardiac monitor, continuous pulse ox and blood pressure Approach: midline Location: L3-4 Injection technique: single-shot Needle Needle type: Pencan  Needle gauge: 24 G Needle length: 9 cm Assessment Sensory level: T4 Events: CSF return Additional Notes IV functioning, monitors applied to pt. Expiration date of kit checked and confirmed to be in date. Sterile prep and drape, hand hygiene and sterile gloved used. Pt was positioned and spine was prepped in sterile fashion. Skin was anesthetized with lidocaine. Free flow of clear CSF obtained prior to injecting local anesthetic into CSF x 1 attempt. Spinal needle aspirated freely following injection. Needle was carefully withdrawn, and pt tolerated procedure well. Loss of motor and sensory on exam post injection.

## 2021-08-16 NOTE — Evaluation (Signed)
Physical Therapy Evaluation Patient Details Name: Jim Murray MRN: [id number] DOB: 1941/06/14 Today's Date: 08/16/2021  History of Present Illness  Pt s/p R THR and with hx of spinal stenosis s/p lumbar laminectomy  Clinical Impression  Pt s/p R THR and presents with decreased R LE strength/ROM and post op pain limiting functional mobility.  Pt mobilizing this date at min guard/sup level including stairs.  Dtr present for family ed including performance of HEP with written instruction provided and reviewed.  Pt eager for dc home.     Recommendations for follow up therapy are one component of a multi-disciplinary discharge planning process, led by the attending physician.  Recommendations may be updated based on patient status, additional functional criteria and insurance authorization.  Follow Up Recommendations Follow physician's recommendations for discharge plan and follow up therapies    Assistance Recommended at Discharge Intermittent Supervision/Assistance  Patient can return home with the following       Equipment Recommendations Rolling walker (2 wheels);BSC/3in1  Recommendations for Other Services       Functional Status Assessment Patient has had a recent decline in their functional status and demonstrates the ability to make significant improvements in function in a reasonable and predictable amount of time.     Precautions / Restrictions Precautions Precautions: Fall Restrictions Weight Bearing Restrictions: No Other Position/Activity Restrictions: WBAT      Mobility  Bed Mobility Overal bed mobility: Needs Assistance Bed Mobility: Supine to Sit     Supine to sit: Min guard     General bed mobility comments: cues for sequence; min guard for safety    Transfers Overall transfer level: Needs assistance Equipment used: Rolling walker (2 wheels) Transfers: Sit to/from Stand Sit to Stand: Min guard           General transfer comment: cues for LE  management and use of UEs to self assist    Ambulation/Gait Ambulation/Gait assistance: Min guard;Supervision Gait Distance (Feet): 150 Feet Assistive device: Rolling walker (2 wheels) Gait Pattern/deviations: Step-to pattern Gait velocity: decr     General Gait Details: cues for sequence, posture and position from RW  Stairs Stairs: Yes Stairs assistance: Min guard Stair Management: Two rails;Step to pattern;Forwards Number of Stairs: 3 General stair comments: cues for sequence  Wheelchair Mobility    Modified Rankin (Stroke Patients Only)       Balance Overall balance assessment: Needs assistance Sitting-balance support: No upper extremity supported;Feet unsupported Sitting balance-Leahy Scale: Good     Standing balance support: No upper extremity supported Standing balance-Leahy Scale: Fair                               Pertinent Vitals/Pain Pain Assessment: 0-10 Pain Score: 2  Pain Location: R hip Pain Descriptors / Indicators: Aching;Sore Pain Intervention(s): Limited activity within patient's tolerance;Monitored during session;Premedicated before session    Home Living Family/patient expects to be discharged to:: Private residence Living Arrangements: Children Available Help at Discharge: Family Type of Home: House Home Access: Level entry   Entrance Stairs-Number of Steps: dtr states level entry to basement   Home Layout: One level Home Equipment: Cane - single point      Prior Function Prior Level of Function : Independent/Modified Independent                     Hand Dominance        Extremity/Trunk Assessment   Upper Extremity Assessment Upper  Extremity Assessment: Overall WFL for tasks assessed    Lower Extremity Assessment Lower Extremity Assessment: RLE deficits/detail RLE Deficits / Details: 3-/5 strength at hip with AAROM at hip to 90 flex and 15 abd    Cervical / Trunk Assessment Cervical / Trunk  Assessment: Normal  Communication   Communication: Prefers language other than English  Cognition Arousal/Alertness: Awake/alert Behavior During Therapy: WFL for tasks assessed/performed Overall Cognitive Status: Within Functional Limits for tasks assessed                                          General Comments      Exercises Total Joint Exercises Ankle Circles/Pumps: AROM;Both;15 reps;Supine Quad Sets: AROM;Both;10 reps;Supine Heel Slides: AAROM;Right;20 reps;Supine Hip ABduction/ADduction: AAROM;Right;15 reps;Supine Long Arc Quad: AROM;Right;10 reps;Seated   Assessment/Plan    PT Assessment Patient needs continued PT services  PT Problem List Decreased strength;Decreased range of motion;Decreased activity tolerance;Decreased balance;Decreased mobility;Decreased knowledge of use of DME;Pain       PT Treatment Interventions DME instruction;Gait training;Stair training;Functional mobility training;Therapeutic activities;Therapeutic exercise;Patient/family education    PT Goals (Current goals can be found in the Care Plan section)  Acute Rehab PT Goals Patient Stated Goal: Regain IND PT Goal Formulation: All assessment and education complete, DC therapy    Frequency Min 1X/week     Co-evaluation               AM-PAC PT "6 Clicks" Mobility  Outcome Measure Help needed turning from your back to your side while in a flat bed without using bedrails?: A Little Help needed moving from lying on your back to sitting on the side of a flat bed without using bedrails?: A Little Help needed moving to and from a bed to a chair (including a wheelchair)?: A Little Help needed standing up from a chair using your arms (e.g., wheelchair or bedside chair)?: A Little Help needed to walk in hospital room?: A Little Help needed climbing 3-5 steps with a railing? : A Little 6 Click Score: 18    End of Session Equipment Utilized During Treatment: Gait belt Activity  Tolerance: Patient tolerated treatment well Patient left: in chair;with call bell/phone within reach;with nursing/sitter in room;with family/visitor present Nurse Communication: Mobility status PT Visit Diagnosis: Difficulty in walking, not elsewhere classified (R26.2)    Time: 7341-9379 PT Time Calculation (min) (ACUTE ONLY): 47 min   Charges:   PT Evaluation $PT Eval Low Complexity: 1 Low PT Treatments $Gait Training: 8-22 mins $Therapeutic Exercise: 8-22 mins        Mauro Kaufmann PT Acute Rehabilitation Services Pager [phone] Office [phone]   Prajna Vanderpool 08/16/2021, 2:11 PM
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
## 2021-08-16 NOTE — Interval H&P Note (Signed)
History and Physical Interval Note:  08/16/2021 7:24 AM  Jim Murray  has presented today for surgery, with the diagnosis of Right hip osteoarthritis.  The various methods of treatment have been discussed with the patient and family. After consideration of risks, benefits and other options for treatment, the patient has consented to  Procedure(s): TOTAL HIP ARTHROPLASTY ANTERIOR APPROACH (Right) as a surgical intervention.  The patient's history has been reviewed, patient examined, no change in status, stable for surgery.  I have reviewed the patient's chart and labs.  Questions were answered to the patient's satisfaction.     Hilton Cork Cardell Rachel

## 2021-08-16 NOTE — Discharge Instructions (Signed)
 Dr. Hareem Surowiec Total Joint Specialist [facility] [address] [place], [place] [address] [phone]  TOTAL KNEE REPLACEMENT POSTOPERATIVE DIRECTIONS    Knee Rehabilitation, Guidelines Following Surgery  Results after knee surgery are often greatly improved when you follow the exercise, range of motion and muscle strengthening exercises prescribed by your doctor. Safety measures are also important to protect the knee from further injury. Any time any of these exercises cause you to have increased pain or swelling in your knee joint, decrease the amount until you are comfortable again and slowly increase them. If you have problems or questions, call your caregiver or physical therapist for advice.   WEIGHT BEARING Weight bearing as tolerated with assist device (walker, cane, etc) as directed, use it as long as suggested by your surgeon or therapist, typically at least 4-6 weeks.  HOME CARE INSTRUCTIONS  Remove items at home which could result in a fall. This includes throw rugs or furniture in walking pathways.  Continue medications as instructed at time of discharge. You may have some home medications which will be placed on hold until you complete the course of blood thinner medication.  You may start showering once you are discharged home but do not submerge the incision under water. Just pat the incision dry and apply a dry gauze dressing on daily. Walk with walker as instructed.  You may resume a sexual relationship in one month or when given the OK by your doctor.  Use walker as long as suggested by your caregivers. Avoid periods of inactivity such as sitting longer than an hour when not asleep. This helps prevent blood clots.  You may put full weight on your legs and walk as much as is comfortable.  You may return to work once you are cleared by your doctor.  Do not drive a car for 6 weeks or until released by you surgeon.  Do not drive while  taking narcotics.  Wear the elastic stockings for three weeks following surgery during the day but you may remove then at night. Make sure you keep all of your appointments after your operation with all of your doctors and caregivers. You should call the office at the above phone number and make an appointment for approximately two weeks after the date of your surgery. Do not remove your surgical dressing. The dressing is waterproof; you may take showers in 3 days, but do not take tub baths or submerge the dressing. Please pick up a stool softener and laxative for home use as long as you are requiring pain medications. ICE to the affected knee every three hours for 30 minutes at a time and then as needed for pain and swelling.  Continue to use ice on the knee for pain and swelling from surgery. You may notice swelling that will progress down to the foot and ankle.  This is normal after surgery.  Elevate the leg when you are not up walking on it.   It is important for you to complete the blood thinner medication as prescribed by your doctor. Continue to use the breathing machine which will help keep your temperature down.  It is common for your temperature to cycle up and down following surgery, especially at night when you are not up moving around and exerting yourself.  The breathing machine keeps your lungs expanded and your temperature down.  RANGE OF MOTION AND STRENGTHENING EXERCISES  Rehabilitation of the knee is important following a knee injury or an   operation. After just a few days of immobilization, the muscles of the thigh which control the knee become weakened and shrink (atrophy). Knee exercises are designed to build up the tone and strength of the thigh muscles and to improve knee motion. Often times heat used for twenty to thirty minutes before working out will loosen up your tissues and help with improving the range of motion but do not use heat for the first two weeks following surgery.  These exercises can be done on a training (exercise) mat, on the floor, on a table or on a bed. Use what ever works the best and is most comfortable for you Knee exercises include:  Leg Lifts - While your knee is still immobilized in a splint or cast, you can do straight leg raises. Lift the leg to 60 degrees, hold for 3 sec, and slowly lower the leg. Repeat 10-20 times 2-3 times daily. Perform this exercise against resistance later as your knee gets better.  Quad and Hamstring Sets - Tighten up the muscle on the front of the thigh (Quad) and hold for 5-10 sec. Repeat this 10-20 times hourly. Hamstring sets are done by pushing the foot backward against an object and holding for 5-10 sec. Repeat as with quad sets.  A rehabilitation program following serious knee injuries can speed recovery and prevent re-injury in the future due to weakened muscles. Contact your doctor or a physical therapist for more information on knee rehabilitation.   POST-OPERATIVE OPIOID TAPER INSTRUCTIONS: It is important to wean off of your opioid medication as soon as possible. If you do not need pain medication after your surgery it is ok to stop day one. Opioids include: Codeine, Hydrocodone(Norco, Vicodin), Oxycodone(Percocet, oxycontin) and hydromorphone amongst others.  Long term and even short term use of opiods can cause: Increased pain response Dependence Constipation Depression Respiratory depression And more.  Withdrawal symptoms can include Flu like symptoms Nausea, vomiting And more Techniques to manage these symptoms Hydrate well Eat regular healthy meals Stay active Use relaxation techniques(deep breathing, meditating, yoga) Do Not substitute Alcohol to help with tapering If you have been on opioids for less than two weeks and do not have pain than it is ok to stop all together.  Plan to wean off of opioids This plan should start within one week post op of your joint replacement. Maintain the same  interval or time between taking each dose and first decrease the dose.  Cut the total daily intake of opioids by one tablet each day Next start to increase the time between doses. The last dose that should be eliminated is the evening dose.    SKILLED REHAB INSTRUCTIONS: If the patient is transferred to a skilled rehab facility following release from the hospital, a list of the current medications will be sent to the facility for the patient to continue.  When discharged from the skilled rehab facility, please have the facility set up the patient's Home Health Physical Therapy prior to being released. Also, the skilled facility will be responsible for providing the patient with their medications at time of release from the facility to include their pain medication, the muscle relaxants, and their blood thinner medication. If the patient is still at the rehab facility at time of the two week follow up appointment, the skilled rehab facility will also need to assist the patient in arranging follow up appointment in our office and any transportation needs.  MAKE SURE YOU:  Understand these instructions.  Will watch   your condition.  Will get help right away if you are not doing well or get worse.    Pick up stool softner and laxative for home use following surgery while on pain medications. Do NOT remove your dressing. You may shower.  Do not take tub baths or submerge incision under water. May shower starting three days after surgery. Please use a clean towel to pat the incision dry following showers. Continue to use ice for pain and swelling after surgery. Do not use any lotions or creams on the incision until instructed by your surgeon.  

## 2021-08-16 NOTE — Transfer of Care (Signed)
Immediate Anesthesia Transfer of Care Note  Patient: Jim Murray  Procedure(s) Performed: TOTAL HIP ARTHROPLASTY ANTERIOR APPROACH (Right: Hip)  Patient Location: PACU  Anesthesia Type:Spinal  Level of Consciousness: awake and drowsy  Airway & Oxygen Therapy: Patient Spontanous Breathing and Patient connected to face mask oxygen  Post-op Assessment: Report given to RN and Post -op Vital signs reviewed and stable  Post vital signs: Reviewed and stable  Last Vitals:  Vitals Value Taken Time  BP 116/50 08/16/21 1015  Temp    Pulse 55 08/16/21 1016  Resp 14 08/16/21 1016  SpO2 99 % 08/16/21 1016  Vitals shown include unvalidated device data.  Last Pain:  Vitals:   08/16/21 0608  TempSrc:   PainSc: 0-No pain         Complications: No notable events documented.

## 2021-08-16 NOTE — Anesthesia Postprocedure Evaluation (Signed)
Anesthesia Post Note  Patient: Jim Murray  Procedure(s) Performed: TOTAL HIP ARTHROPLASTY ANTERIOR APPROACH (Right: Hip)     Patient location during evaluation: PACU Anesthesia Type: Spinal Level of consciousness: awake Pain management: pain level controlled Vital Signs Assessment: post-procedure vital signs reviewed and stable Respiratory status: spontaneous breathing Cardiovascular status: stable Postop Assessment: no apparent nausea or vomiting Anesthetic complications: no   No notable events documented.  Last Vitals:  Vitals:   08/16/21 1200 08/16/21 1315  BP: 135/70 123/71  Pulse: (!) 59 66  Resp:    Temp:    SpO2: 95% 98%    Last Pain:  Vitals:   08/16/21 1315  TempSrc:   PainSc: 6                  Brenee Gajda

## 2021-08-16 NOTE — Care Plan (Signed)
Ortho Bundle Case Management Note  Patient Details  Name: Jim Murray MRN: [id number] Date of Birth: November 02, 1940                  R THA on 08-16-21 DCP: Home with dtr. DME: RW ordered through [facility] PT: HEP   DME Arranged:  Walker rolling DME Agency:  [employer]  HH Arranged:    [place] Agency:     Additional Comments: Please contact me with any questions of if this plan should need to change.  Marianne Sofia, RN,CCM [facility]  [phone] 08/16/2021, 9:39 AM

## 2021-08-20 ENCOUNTER — Encounter (HOSPITAL_COMMUNITY): Payer: Self-pay | Admitting: Orthopedic Surgery

## 2021-08-31 DIAGNOSIS — Z96641 Presence of right artificial hip joint: Secondary | ICD-10-CM | POA: Diagnosis not present

## 2021-08-31 DIAGNOSIS — Z4789 Encounter for other orthopedic aftercare: Secondary | ICD-10-CM | POA: Diagnosis not present

## 2021-08-31 DIAGNOSIS — Z471 Aftercare following joint replacement surgery: Secondary | ICD-10-CM | POA: Diagnosis not present

## 2021-09-11 DIAGNOSIS — M1711 Unilateral primary osteoarthritis, right knee: Secondary | ICD-10-CM | POA: Diagnosis not present

## 2021-09-24 DIAGNOSIS — Z4789 Encounter for other orthopedic aftercare: Secondary | ICD-10-CM | POA: Diagnosis not present

## 2022-01-21 DIAGNOSIS — Z96641 Presence of right artificial hip joint: Secondary | ICD-10-CM | POA: Diagnosis not present

## 2022-01-21 DIAGNOSIS — M25551 Pain in right hip: Secondary | ICD-10-CM | POA: Diagnosis not present

## 2022-01-21 DIAGNOSIS — Z471 Aftercare following joint replacement surgery: Secondary | ICD-10-CM | POA: Diagnosis not present

## 2022-01-21 DIAGNOSIS — S76011A Strain of muscle, fascia and tendon of right hip, initial encounter: Secondary | ICD-10-CM | POA: Diagnosis not present

## 2022-04-05 DIAGNOSIS — I10 Essential (primary) hypertension: Secondary | ICD-10-CM | POA: Diagnosis not present

## 2022-04-05 DIAGNOSIS — I700 Atherosclerosis of aorta: Secondary | ICD-10-CM | POA: Diagnosis not present

## 2022-04-05 DIAGNOSIS — M25561 Pain in right knee: Secondary | ICD-10-CM | POA: Diagnosis not present

## 2022-04-05 DIAGNOSIS — Z299 Encounter for prophylactic measures, unspecified: Secondary | ICD-10-CM | POA: Diagnosis not present

## 2022-04-05 DIAGNOSIS — R350 Frequency of micturition: Secondary | ICD-10-CM | POA: Diagnosis not present

## 2022-04-23 DIAGNOSIS — M1711 Unilateral primary osteoarthritis, right knee: Secondary | ICD-10-CM | POA: Diagnosis not present

## 2023-01-07 DIAGNOSIS — I10 Essential (primary) hypertension: Secondary | ICD-10-CM | POA: Diagnosis not present

## 2023-01-07 DIAGNOSIS — R059 Cough, unspecified: Secondary | ICD-10-CM | POA: Diagnosis not present

## 2023-01-07 DIAGNOSIS — N390 Urinary tract infection, site not specified: Secondary | ICD-10-CM | POA: Diagnosis not present

## 2023-01-07 DIAGNOSIS — M1990 Unspecified osteoarthritis, unspecified site: Secondary | ICD-10-CM | POA: Diagnosis not present

## 2023-01-07 DIAGNOSIS — R350 Frequency of micturition: Secondary | ICD-10-CM | POA: Diagnosis not present

## 2023-01-07 DIAGNOSIS — R5383 Other fatigue: Secondary | ICD-10-CM | POA: Diagnosis not present

## 2023-01-07 DIAGNOSIS — Z299 Encounter for prophylactic measures, unspecified: Secondary | ICD-10-CM | POA: Diagnosis not present

## 2023-01-07 DIAGNOSIS — Z79899 Other long term (current) drug therapy: Secondary | ICD-10-CM | POA: Diagnosis not present

## 2023-01-14 DIAGNOSIS — Z299 Encounter for prophylactic measures, unspecified: Secondary | ICD-10-CM | POA: Diagnosis not present

## 2023-01-14 DIAGNOSIS — M25561 Pain in right knee: Secondary | ICD-10-CM | POA: Diagnosis not present

## 2023-01-15 DIAGNOSIS — I10 Essential (primary) hypertension: Secondary | ICD-10-CM | POA: Diagnosis not present

## 2023-01-15 DIAGNOSIS — Z299 Encounter for prophylactic measures, unspecified: Secondary | ICD-10-CM | POA: Diagnosis not present

## 2023-01-15 DIAGNOSIS — M25562 Pain in left knee: Secondary | ICD-10-CM | POA: Diagnosis not present

## 2023-01-22 DIAGNOSIS — M25561 Pain in right knee: Secondary | ICD-10-CM | POA: Diagnosis not present

## 2023-01-22 DIAGNOSIS — Z299 Encounter for prophylactic measures, unspecified: Secondary | ICD-10-CM | POA: Diagnosis not present

## 2023-01-22 DIAGNOSIS — M25562 Pain in left knee: Secondary | ICD-10-CM | POA: Diagnosis not present

## 2023-01-22 DIAGNOSIS — M1710 Unilateral primary osteoarthritis, unspecified knee: Secondary | ICD-10-CM | POA: Diagnosis not present

## 2023-01-22 DIAGNOSIS — M170 Bilateral primary osteoarthritis of knee: Secondary | ICD-10-CM | POA: Diagnosis not present

## 2023-01-22 DIAGNOSIS — I10 Essential (primary) hypertension: Secondary | ICD-10-CM | POA: Diagnosis not present

## 2023-09-16 DIAGNOSIS — R079 Chest pain, unspecified: Secondary | ICD-10-CM | POA: Diagnosis not present

## 2023-09-16 DIAGNOSIS — R002 Palpitations: Secondary | ICD-10-CM | POA: Diagnosis not present

## 2023-09-16 DIAGNOSIS — R0602 Shortness of breath: Secondary | ICD-10-CM | POA: Diagnosis not present

## 2023-09-16 DIAGNOSIS — Z299 Encounter for prophylactic measures, unspecified: Secondary | ICD-10-CM | POA: Diagnosis not present

## 2023-09-16 DIAGNOSIS — I10 Essential (primary) hypertension: Secondary | ICD-10-CM | POA: Diagnosis not present

## 2023-09-23 DIAGNOSIS — R002 Palpitations: Secondary | ICD-10-CM | POA: Diagnosis not present

## 2023-09-23 DIAGNOSIS — H6122 Impacted cerumen, left ear: Secondary | ICD-10-CM | POA: Diagnosis not present

## 2023-09-23 DIAGNOSIS — I10 Essential (primary) hypertension: Secondary | ICD-10-CM | POA: Diagnosis not present

## 2023-09-23 DIAGNOSIS — Z299 Encounter for prophylactic measures, unspecified: Secondary | ICD-10-CM | POA: Diagnosis not present

## 2023-10-03 DIAGNOSIS — M170 Bilateral primary osteoarthritis of knee: Secondary | ICD-10-CM | POA: Diagnosis not present

## 2023-10-03 DIAGNOSIS — Z96641 Presence of right artificial hip joint: Secondary | ICD-10-CM | POA: Diagnosis not present

## 2023-10-09 DIAGNOSIS — R059 Cough, unspecified: Secondary | ICD-10-CM | POA: Diagnosis not present

## 2023-10-09 DIAGNOSIS — Z299 Encounter for prophylactic measures, unspecified: Secondary | ICD-10-CM | POA: Diagnosis not present

## 2023-10-09 DIAGNOSIS — J069 Acute upper respiratory infection, unspecified: Secondary | ICD-10-CM | POA: Diagnosis not present

## 2023-10-09 DIAGNOSIS — R0981 Nasal congestion: Secondary | ICD-10-CM | POA: Diagnosis not present

## 2023-10-11 DIAGNOSIS — R030 Elevated blood-pressure reading, without diagnosis of hypertension: Secondary | ICD-10-CM | POA: Diagnosis not present

## 2023-10-11 DIAGNOSIS — R062 Wheezing: Secondary | ICD-10-CM | POA: Diagnosis not present

## 2023-10-11 DIAGNOSIS — R059 Cough, unspecified: Secondary | ICD-10-CM | POA: Diagnosis not present

## 2023-10-11 DIAGNOSIS — J22 Unspecified acute lower respiratory infection: Secondary | ICD-10-CM | POA: Diagnosis not present

## 2023-10-13 DIAGNOSIS — R002 Palpitations: Secondary | ICD-10-CM | POA: Diagnosis not present

## 2023-10-15 DIAGNOSIS — R0989 Other specified symptoms and signs involving the circulatory and respiratory systems: Secondary | ICD-10-CM | POA: Diagnosis not present

## 2023-10-15 DIAGNOSIS — J069 Acute upper respiratory infection, unspecified: Secondary | ICD-10-CM | POA: Diagnosis not present

## 2023-10-15 DIAGNOSIS — Z299 Encounter for prophylactic measures, unspecified: Secondary | ICD-10-CM | POA: Diagnosis not present

## 2023-10-15 DIAGNOSIS — R062 Wheezing: Secondary | ICD-10-CM | POA: Diagnosis not present

## 2023-10-15 DIAGNOSIS — I471 Supraventricular tachycardia, unspecified: Secondary | ICD-10-CM | POA: Diagnosis not present

## 2023-10-15 DIAGNOSIS — R6883 Chills (without fever): Secondary | ICD-10-CM | POA: Diagnosis not present

## 2023-10-15 DIAGNOSIS — R079 Chest pain, unspecified: Secondary | ICD-10-CM | POA: Diagnosis not present

## 2023-10-15 DIAGNOSIS — R002 Palpitations: Secondary | ICD-10-CM | POA: Diagnosis not present

## 2023-10-15 DIAGNOSIS — R059 Cough, unspecified: Secondary | ICD-10-CM | POA: Diagnosis not present

## 2023-10-15 DIAGNOSIS — R051 Acute cough: Secondary | ICD-10-CM | POA: Diagnosis not present

## 2023-10-15 DIAGNOSIS — R918 Other nonspecific abnormal finding of lung field: Secondary | ICD-10-CM | POA: Diagnosis not present

## 2023-10-22 DIAGNOSIS — R918 Other nonspecific abnormal finding of lung field: Secondary | ICD-10-CM | POA: Diagnosis not present

## 2023-10-22 DIAGNOSIS — I471 Supraventricular tachycardia, unspecified: Secondary | ICD-10-CM | POA: Diagnosis not present

## 2023-10-22 DIAGNOSIS — R52 Pain, unspecified: Secondary | ICD-10-CM | POA: Diagnosis not present

## 2023-10-22 DIAGNOSIS — J9811 Atelectasis: Secondary | ICD-10-CM | POA: Diagnosis not present

## 2023-10-22 DIAGNOSIS — I10 Essential (primary) hypertension: Secondary | ICD-10-CM | POA: Diagnosis not present

## 2023-10-22 DIAGNOSIS — Z299 Encounter for prophylactic measures, unspecified: Secondary | ICD-10-CM | POA: Diagnosis not present

## 2023-10-22 DIAGNOSIS — J069 Acute upper respiratory infection, unspecified: Secondary | ICD-10-CM | POA: Diagnosis not present

## 2023-12-19 DIAGNOSIS — I700 Atherosclerosis of aorta: Secondary | ICD-10-CM | POA: Diagnosis not present

## 2023-12-19 DIAGNOSIS — Z299 Encounter for prophylactic measures, unspecified: Secondary | ICD-10-CM | POA: Diagnosis not present

## 2023-12-19 DIAGNOSIS — R52 Pain, unspecified: Secondary | ICD-10-CM | POA: Diagnosis not present

## 2023-12-19 DIAGNOSIS — I10 Essential (primary) hypertension: Secondary | ICD-10-CM | POA: Diagnosis not present

## 2023-12-19 DIAGNOSIS — I471 Supraventricular tachycardia, unspecified: Secondary | ICD-10-CM | POA: Diagnosis not present

## 2023-12-19 DIAGNOSIS — L739 Follicular disorder, unspecified: Secondary | ICD-10-CM | POA: Diagnosis not present

## 2023-12-22 DIAGNOSIS — Z1389 Encounter for screening for other disorder: Secondary | ICD-10-CM | POA: Diagnosis not present

## 2023-12-22 DIAGNOSIS — Z0000 Encounter for general adult medical examination without abnormal findings: Secondary | ICD-10-CM | POA: Diagnosis not present

## 2023-12-22 DIAGNOSIS — I471 Supraventricular tachycardia, unspecified: Secondary | ICD-10-CM | POA: Diagnosis not present

## 2023-12-22 DIAGNOSIS — I700 Atherosclerosis of aorta: Secondary | ICD-10-CM | POA: Diagnosis not present

## 2023-12-22 DIAGNOSIS — I10 Essential (primary) hypertension: Secondary | ICD-10-CM | POA: Diagnosis not present

## 2023-12-22 DIAGNOSIS — Z7189 Other specified counseling: Secondary | ICD-10-CM | POA: Diagnosis not present

## 2023-12-22 DIAGNOSIS — Z299 Encounter for prophylactic measures, unspecified: Secondary | ICD-10-CM | POA: Diagnosis not present

## 2023-12-30 DIAGNOSIS — Z01818 Encounter for other preprocedural examination: Secondary | ICD-10-CM | POA: Diagnosis not present

## 2023-12-30 DIAGNOSIS — M1712 Unilateral primary osteoarthritis, left knee: Secondary | ICD-10-CM | POA: Diagnosis not present

## 2024-01-08 DIAGNOSIS — Z9189 Other specified personal risk factors, not elsewhere classified: Secondary | ICD-10-CM | POA: Diagnosis not present

## 2024-01-08 DIAGNOSIS — I10 Essential (primary) hypertension: Secondary | ICD-10-CM | POA: Diagnosis not present

## 2024-01-08 DIAGNOSIS — Z79899 Other long term (current) drug therapy: Secondary | ICD-10-CM | POA: Diagnosis not present

## 2024-01-08 DIAGNOSIS — M25569 Pain in unspecified knee: Secondary | ICD-10-CM | POA: Diagnosis not present

## 2024-01-08 DIAGNOSIS — Z01818 Encounter for other preprocedural examination: Secondary | ICD-10-CM | POA: Diagnosis not present

## 2024-01-08 DIAGNOSIS — R001 Bradycardia, unspecified: Secondary | ICD-10-CM | POA: Diagnosis not present

## 2024-01-14 DIAGNOSIS — G8918 Other acute postprocedural pain: Secondary | ICD-10-CM | POA: Diagnosis not present

## 2024-01-14 DIAGNOSIS — M25762 Osteophyte, left knee: Secondary | ICD-10-CM | POA: Diagnosis not present

## 2024-01-14 DIAGNOSIS — M25562 Pain in left knee: Secondary | ICD-10-CM | POA: Diagnosis not present

## 2024-01-14 DIAGNOSIS — M1712 Unilateral primary osteoarthritis, left knee: Secondary | ICD-10-CM | POA: Diagnosis not present

## 2024-01-23 DIAGNOSIS — R001 Bradycardia, unspecified: Secondary | ICD-10-CM | POA: Diagnosis not present

## 2024-01-23 DIAGNOSIS — Z96652 Presence of left artificial knee joint: Secondary | ICD-10-CM | POA: Diagnosis not present

## 2024-01-23 DIAGNOSIS — M25562 Pain in left knee: Secondary | ICD-10-CM | POA: Diagnosis not present

## 2024-01-23 DIAGNOSIS — Z7982 Long term (current) use of aspirin: Secondary | ICD-10-CM | POA: Diagnosis not present

## 2024-01-23 DIAGNOSIS — M25569 Pain in unspecified knee: Secondary | ICD-10-CM | POA: Diagnosis not present

## 2024-01-23 DIAGNOSIS — R7303 Prediabetes: Secondary | ICD-10-CM | POA: Diagnosis not present

## 2024-01-23 DIAGNOSIS — Z471 Aftercare following joint replacement surgery: Secondary | ICD-10-CM | POA: Diagnosis not present

## 2024-01-23 DIAGNOSIS — I10 Essential (primary) hypertension: Secondary | ICD-10-CM | POA: Diagnosis not present

## 2024-01-23 DIAGNOSIS — G8918 Other acute postprocedural pain: Secondary | ICD-10-CM | POA: Diagnosis not present

## 2024-01-23 DIAGNOSIS — L03116 Cellulitis of left lower limb: Secondary | ICD-10-CM | POA: Diagnosis not present

## 2024-01-23 DIAGNOSIS — M79605 Pain in left leg: Secondary | ICD-10-CM | POA: Diagnosis not present

## 2024-01-23 DIAGNOSIS — D62 Acute posthemorrhagic anemia: Secondary | ICD-10-CM | POA: Diagnosis not present

## 2024-01-23 DIAGNOSIS — Z792 Long term (current) use of antibiotics: Secondary | ICD-10-CM | POA: Diagnosis not present

## 2024-01-23 DIAGNOSIS — T8140XA Infection following a procedure, unspecified, initial encounter: Secondary | ICD-10-CM | POA: Diagnosis not present

## 2024-01-23 DIAGNOSIS — M25462 Effusion, left knee: Secondary | ICD-10-CM | POA: Diagnosis not present

## 2024-01-24 DIAGNOSIS — D649 Anemia, unspecified: Secondary | ICD-10-CM | POA: Diagnosis not present

## 2024-01-24 DIAGNOSIS — L03116 Cellulitis of left lower limb: Secondary | ICD-10-CM | POA: Diagnosis not present

## 2024-01-24 DIAGNOSIS — I10 Essential (primary) hypertension: Secondary | ICD-10-CM | POA: Diagnosis not present

## 2024-01-24 DIAGNOSIS — G8918 Other acute postprocedural pain: Secondary | ICD-10-CM | POA: Diagnosis not present

## 2024-01-25 DIAGNOSIS — D649 Anemia, unspecified: Secondary | ICD-10-CM | POA: Diagnosis not present

## 2024-01-25 DIAGNOSIS — L03116 Cellulitis of left lower limb: Secondary | ICD-10-CM | POA: Diagnosis not present

## 2024-01-25 DIAGNOSIS — I10 Essential (primary) hypertension: Secondary | ICD-10-CM | POA: Diagnosis not present

## 2024-01-27 DIAGNOSIS — R609 Edema, unspecified: Secondary | ICD-10-CM | POA: Diagnosis not present

## 2024-01-27 DIAGNOSIS — Z471 Aftercare following joint replacement surgery: Secondary | ICD-10-CM | POA: Diagnosis not present

## 2024-01-27 DIAGNOSIS — L03116 Cellulitis of left lower limb: Secondary | ICD-10-CM | POA: Diagnosis not present

## 2024-01-27 DIAGNOSIS — R7303 Prediabetes: Secondary | ICD-10-CM | POA: Diagnosis not present

## 2024-01-27 DIAGNOSIS — I10 Essential (primary) hypertension: Secondary | ICD-10-CM | POA: Diagnosis not present

## 2024-01-27 DIAGNOSIS — R001 Bradycardia, unspecified: Secondary | ICD-10-CM | POA: Diagnosis not present

## 2024-01-27 DIAGNOSIS — D62 Acute posthemorrhagic anemia: Secondary | ICD-10-CM | POA: Diagnosis not present

## 2024-01-27 DIAGNOSIS — K8060 Calculus of gallbladder and bile duct with cholecystitis, unspecified, without obstruction: Secondary | ICD-10-CM | POA: Diagnosis not present

## 2024-01-27 DIAGNOSIS — Z96652 Presence of left artificial knee joint: Secondary | ICD-10-CM | POA: Diagnosis not present

## 2024-01-27 DIAGNOSIS — M549 Dorsalgia, unspecified: Secondary | ICD-10-CM | POA: Diagnosis not present

## 2024-01-27 DIAGNOSIS — N200 Calculus of kidney: Secondary | ICD-10-CM | POA: Diagnosis not present

## 2024-01-28 DIAGNOSIS — Z471 Aftercare following joint replacement surgery: Secondary | ICD-10-CM | POA: Diagnosis not present

## 2024-01-28 DIAGNOSIS — I10 Essential (primary) hypertension: Secondary | ICD-10-CM | POA: Diagnosis not present

## 2024-01-28 DIAGNOSIS — N200 Calculus of kidney: Secondary | ICD-10-CM | POA: Diagnosis not present

## 2024-01-28 DIAGNOSIS — R609 Edema, unspecified: Secondary | ICD-10-CM | POA: Diagnosis not present

## 2024-01-28 DIAGNOSIS — R001 Bradycardia, unspecified: Secondary | ICD-10-CM | POA: Diagnosis not present

## 2024-01-28 DIAGNOSIS — Z96652 Presence of left artificial knee joint: Secondary | ICD-10-CM | POA: Diagnosis not present

## 2024-01-28 DIAGNOSIS — R7303 Prediabetes: Secondary | ICD-10-CM | POA: Diagnosis not present

## 2024-01-28 DIAGNOSIS — K8060 Calculus of gallbladder and bile duct with cholecystitis, unspecified, without obstruction: Secondary | ICD-10-CM | POA: Diagnosis not present

## 2024-01-28 DIAGNOSIS — L03116 Cellulitis of left lower limb: Secondary | ICD-10-CM | POA: Diagnosis not present

## 2024-01-28 DIAGNOSIS — M549 Dorsalgia, unspecified: Secondary | ICD-10-CM | POA: Diagnosis not present

## 2024-01-28 DIAGNOSIS — D62 Acute posthemorrhagic anemia: Secondary | ICD-10-CM | POA: Diagnosis not present

## 2024-01-30 DIAGNOSIS — L03116 Cellulitis of left lower limb: Secondary | ICD-10-CM | POA: Diagnosis not present

## 2024-01-30 DIAGNOSIS — K8060 Calculus of gallbladder and bile duct with cholecystitis, unspecified, without obstruction: Secondary | ICD-10-CM | POA: Diagnosis not present

## 2024-01-30 DIAGNOSIS — D62 Acute posthemorrhagic anemia: Secondary | ICD-10-CM | POA: Diagnosis not present

## 2024-01-30 DIAGNOSIS — I10 Essential (primary) hypertension: Secondary | ICD-10-CM | POA: Diagnosis not present

## 2024-01-30 DIAGNOSIS — Z471 Aftercare following joint replacement surgery: Secondary | ICD-10-CM | POA: Diagnosis not present

## 2024-01-30 DIAGNOSIS — R609 Edema, unspecified: Secondary | ICD-10-CM | POA: Diagnosis not present

## 2024-01-30 DIAGNOSIS — R001 Bradycardia, unspecified: Secondary | ICD-10-CM | POA: Diagnosis not present

## 2024-01-30 DIAGNOSIS — M549 Dorsalgia, unspecified: Secondary | ICD-10-CM | POA: Diagnosis not present

## 2024-01-30 DIAGNOSIS — N200 Calculus of kidney: Secondary | ICD-10-CM | POA: Diagnosis not present

## 2024-01-30 DIAGNOSIS — R7303 Prediabetes: Secondary | ICD-10-CM | POA: Diagnosis not present

## 2024-01-30 DIAGNOSIS — Z96652 Presence of left artificial knee joint: Secondary | ICD-10-CM | POA: Diagnosis not present

## 2024-02-02 DIAGNOSIS — R609 Edema, unspecified: Secondary | ICD-10-CM | POA: Diagnosis not present

## 2024-02-02 DIAGNOSIS — R001 Bradycardia, unspecified: Secondary | ICD-10-CM | POA: Diagnosis not present

## 2024-02-02 DIAGNOSIS — N200 Calculus of kidney: Secondary | ICD-10-CM | POA: Diagnosis not present

## 2024-02-02 DIAGNOSIS — R7303 Prediabetes: Secondary | ICD-10-CM | POA: Diagnosis not present

## 2024-02-02 DIAGNOSIS — L03116 Cellulitis of left lower limb: Secondary | ICD-10-CM | POA: Diagnosis not present

## 2024-02-02 DIAGNOSIS — I10 Essential (primary) hypertension: Secondary | ICD-10-CM | POA: Diagnosis not present

## 2024-02-02 DIAGNOSIS — D62 Acute posthemorrhagic anemia: Secondary | ICD-10-CM | POA: Diagnosis not present

## 2024-02-02 DIAGNOSIS — M549 Dorsalgia, unspecified: Secondary | ICD-10-CM | POA: Diagnosis not present

## 2024-02-02 DIAGNOSIS — Z96652 Presence of left artificial knee joint: Secondary | ICD-10-CM | POA: Diagnosis not present

## 2024-02-02 DIAGNOSIS — Z471 Aftercare following joint replacement surgery: Secondary | ICD-10-CM | POA: Diagnosis not present

## 2024-02-02 DIAGNOSIS — K8060 Calculus of gallbladder and bile duct with cholecystitis, unspecified, without obstruction: Secondary | ICD-10-CM | POA: Diagnosis not present

## 2024-02-03 DIAGNOSIS — M25562 Pain in left knee: Secondary | ICD-10-CM | POA: Diagnosis not present

## 2024-02-03 DIAGNOSIS — M7989 Other specified soft tissue disorders: Secondary | ICD-10-CM | POA: Diagnosis not present

## 2024-02-04 DIAGNOSIS — R609 Edema, unspecified: Secondary | ICD-10-CM | POA: Diagnosis not present

## 2024-02-04 DIAGNOSIS — M549 Dorsalgia, unspecified: Secondary | ICD-10-CM | POA: Diagnosis not present

## 2024-02-04 DIAGNOSIS — I10 Essential (primary) hypertension: Secondary | ICD-10-CM | POA: Diagnosis not present

## 2024-02-04 DIAGNOSIS — R001 Bradycardia, unspecified: Secondary | ICD-10-CM | POA: Diagnosis not present

## 2024-02-04 DIAGNOSIS — K8060 Calculus of gallbladder and bile duct with cholecystitis, unspecified, without obstruction: Secondary | ICD-10-CM | POA: Diagnosis not present

## 2024-02-04 DIAGNOSIS — D62 Acute posthemorrhagic anemia: Secondary | ICD-10-CM | POA: Diagnosis not present

## 2024-02-04 DIAGNOSIS — L03116 Cellulitis of left lower limb: Secondary | ICD-10-CM | POA: Diagnosis not present

## 2024-02-04 DIAGNOSIS — N200 Calculus of kidney: Secondary | ICD-10-CM | POA: Diagnosis not present

## 2024-02-04 DIAGNOSIS — Z471 Aftercare following joint replacement surgery: Secondary | ICD-10-CM | POA: Diagnosis not present

## 2024-02-04 DIAGNOSIS — R7303 Prediabetes: Secondary | ICD-10-CM | POA: Diagnosis not present

## 2024-02-04 DIAGNOSIS — Z96652 Presence of left artificial knee joint: Secondary | ICD-10-CM | POA: Diagnosis not present

## 2024-02-06 DIAGNOSIS — Z471 Aftercare following joint replacement surgery: Secondary | ICD-10-CM | POA: Diagnosis not present

## 2024-02-06 DIAGNOSIS — R609 Edema, unspecified: Secondary | ICD-10-CM | POA: Diagnosis not present

## 2024-02-06 DIAGNOSIS — N200 Calculus of kidney: Secondary | ICD-10-CM | POA: Diagnosis not present

## 2024-02-06 DIAGNOSIS — M549 Dorsalgia, unspecified: Secondary | ICD-10-CM | POA: Diagnosis not present

## 2024-02-06 DIAGNOSIS — Z96652 Presence of left artificial knee joint: Secondary | ICD-10-CM | POA: Diagnosis not present

## 2024-02-06 DIAGNOSIS — D62 Acute posthemorrhagic anemia: Secondary | ICD-10-CM | POA: Diagnosis not present

## 2024-02-06 DIAGNOSIS — L03116 Cellulitis of left lower limb: Secondary | ICD-10-CM | POA: Diagnosis not present

## 2024-02-06 DIAGNOSIS — I10 Essential (primary) hypertension: Secondary | ICD-10-CM | POA: Diagnosis not present

## 2024-02-06 DIAGNOSIS — K8060 Calculus of gallbladder and bile duct with cholecystitis, unspecified, without obstruction: Secondary | ICD-10-CM | POA: Diagnosis not present

## 2024-02-06 DIAGNOSIS — R001 Bradycardia, unspecified: Secondary | ICD-10-CM | POA: Diagnosis not present

## 2024-02-06 DIAGNOSIS — R7303 Prediabetes: Secondary | ICD-10-CM | POA: Diagnosis not present

## 2024-02-10 DIAGNOSIS — L03116 Cellulitis of left lower limb: Secondary | ICD-10-CM | POA: Diagnosis not present

## 2024-02-10 DIAGNOSIS — N200 Calculus of kidney: Secondary | ICD-10-CM | POA: Diagnosis not present

## 2024-02-10 DIAGNOSIS — K8060 Calculus of gallbladder and bile duct with cholecystitis, unspecified, without obstruction: Secondary | ICD-10-CM | POA: Diagnosis not present

## 2024-02-10 DIAGNOSIS — R7303 Prediabetes: Secondary | ICD-10-CM | POA: Diagnosis not present

## 2024-02-10 DIAGNOSIS — M549 Dorsalgia, unspecified: Secondary | ICD-10-CM | POA: Diagnosis not present

## 2024-02-10 DIAGNOSIS — R001 Bradycardia, unspecified: Secondary | ICD-10-CM | POA: Diagnosis not present

## 2024-02-10 DIAGNOSIS — Z96652 Presence of left artificial knee joint: Secondary | ICD-10-CM | POA: Diagnosis not present

## 2024-02-10 DIAGNOSIS — D62 Acute posthemorrhagic anemia: Secondary | ICD-10-CM | POA: Diagnosis not present

## 2024-02-10 DIAGNOSIS — Z471 Aftercare following joint replacement surgery: Secondary | ICD-10-CM | POA: Diagnosis not present

## 2024-02-10 DIAGNOSIS — R609 Edema, unspecified: Secondary | ICD-10-CM | POA: Diagnosis not present

## 2024-02-10 DIAGNOSIS — I10 Essential (primary) hypertension: Secondary | ICD-10-CM | POA: Diagnosis not present

## 2024-02-11 DIAGNOSIS — I10 Essential (primary) hypertension: Secondary | ICD-10-CM | POA: Diagnosis not present

## 2024-02-11 DIAGNOSIS — N200 Calculus of kidney: Secondary | ICD-10-CM | POA: Diagnosis not present

## 2024-02-11 DIAGNOSIS — R001 Bradycardia, unspecified: Secondary | ICD-10-CM | POA: Diagnosis not present

## 2024-02-11 DIAGNOSIS — M549 Dorsalgia, unspecified: Secondary | ICD-10-CM | POA: Diagnosis not present

## 2024-02-11 DIAGNOSIS — K8060 Calculus of gallbladder and bile duct with cholecystitis, unspecified, without obstruction: Secondary | ICD-10-CM | POA: Diagnosis not present

## 2024-02-11 DIAGNOSIS — L03116 Cellulitis of left lower limb: Secondary | ICD-10-CM | POA: Diagnosis not present

## 2024-02-11 DIAGNOSIS — Z96652 Presence of left artificial knee joint: Secondary | ICD-10-CM | POA: Diagnosis not present

## 2024-02-11 DIAGNOSIS — R609 Edema, unspecified: Secondary | ICD-10-CM | POA: Diagnosis not present

## 2024-02-11 DIAGNOSIS — D62 Acute posthemorrhagic anemia: Secondary | ICD-10-CM | POA: Diagnosis not present

## 2024-02-11 DIAGNOSIS — Z471 Aftercare following joint replacement surgery: Secondary | ICD-10-CM | POA: Diagnosis not present

## 2024-02-11 DIAGNOSIS — R7303 Prediabetes: Secondary | ICD-10-CM | POA: Diagnosis not present

## 2024-02-17 DIAGNOSIS — Z471 Aftercare following joint replacement surgery: Secondary | ICD-10-CM | POA: Diagnosis not present

## 2024-02-17 DIAGNOSIS — M549 Dorsalgia, unspecified: Secondary | ICD-10-CM | POA: Diagnosis not present

## 2024-02-17 DIAGNOSIS — Z96652 Presence of left artificial knee joint: Secondary | ICD-10-CM | POA: Diagnosis not present

## 2024-02-17 DIAGNOSIS — D62 Acute posthemorrhagic anemia: Secondary | ICD-10-CM | POA: Diagnosis not present

## 2024-02-17 DIAGNOSIS — K8060 Calculus of gallbladder and bile duct with cholecystitis, unspecified, without obstruction: Secondary | ICD-10-CM | POA: Diagnosis not present

## 2024-02-17 DIAGNOSIS — R609 Edema, unspecified: Secondary | ICD-10-CM | POA: Diagnosis not present

## 2024-02-17 DIAGNOSIS — L03116 Cellulitis of left lower limb: Secondary | ICD-10-CM | POA: Diagnosis not present

## 2024-02-17 DIAGNOSIS — R7303 Prediabetes: Secondary | ICD-10-CM | POA: Diagnosis not present

## 2024-02-17 DIAGNOSIS — I10 Essential (primary) hypertension: Secondary | ICD-10-CM | POA: Diagnosis not present

## 2024-02-17 DIAGNOSIS — N200 Calculus of kidney: Secondary | ICD-10-CM | POA: Diagnosis not present

## 2024-02-17 DIAGNOSIS — R001 Bradycardia, unspecified: Secondary | ICD-10-CM | POA: Diagnosis not present

## 2024-02-19 DIAGNOSIS — K8060 Calculus of gallbladder and bile duct with cholecystitis, unspecified, without obstruction: Secondary | ICD-10-CM | POA: Diagnosis not present

## 2024-02-19 DIAGNOSIS — I10 Essential (primary) hypertension: Secondary | ICD-10-CM | POA: Diagnosis not present

## 2024-02-19 DIAGNOSIS — R7303 Prediabetes: Secondary | ICD-10-CM | POA: Diagnosis not present

## 2024-02-19 DIAGNOSIS — L03116 Cellulitis of left lower limb: Secondary | ICD-10-CM | POA: Diagnosis not present

## 2024-02-19 DIAGNOSIS — Z471 Aftercare following joint replacement surgery: Secondary | ICD-10-CM | POA: Diagnosis not present

## 2024-02-19 DIAGNOSIS — D62 Acute posthemorrhagic anemia: Secondary | ICD-10-CM | POA: Diagnosis not present

## 2024-02-19 DIAGNOSIS — R001 Bradycardia, unspecified: Secondary | ICD-10-CM | POA: Diagnosis not present

## 2024-02-19 DIAGNOSIS — Z96652 Presence of left artificial knee joint: Secondary | ICD-10-CM | POA: Diagnosis not present

## 2024-02-19 DIAGNOSIS — N200 Calculus of kidney: Secondary | ICD-10-CM | POA: Diagnosis not present

## 2024-02-19 DIAGNOSIS — M549 Dorsalgia, unspecified: Secondary | ICD-10-CM | POA: Diagnosis not present

## 2024-02-19 DIAGNOSIS — R609 Edema, unspecified: Secondary | ICD-10-CM | POA: Diagnosis not present

## 2024-02-24 DIAGNOSIS — R001 Bradycardia, unspecified: Secondary | ICD-10-CM | POA: Diagnosis not present

## 2024-02-24 DIAGNOSIS — Z96652 Presence of left artificial knee joint: Secondary | ICD-10-CM | POA: Diagnosis not present

## 2024-02-24 DIAGNOSIS — M6281 Muscle weakness (generalized): Secondary | ICD-10-CM | POA: Diagnosis not present

## 2024-02-24 DIAGNOSIS — M1712 Unilateral primary osteoarthritis, left knee: Secondary | ICD-10-CM | POA: Diagnosis not present

## 2024-02-24 DIAGNOSIS — R7303 Prediabetes: Secondary | ICD-10-CM | POA: Diagnosis not present

## 2024-02-24 DIAGNOSIS — N200 Calculus of kidney: Secondary | ICD-10-CM | POA: Diagnosis not present

## 2024-02-24 DIAGNOSIS — M549 Dorsalgia, unspecified: Secondary | ICD-10-CM | POA: Diagnosis not present

## 2024-02-24 DIAGNOSIS — R609 Edema, unspecified: Secondary | ICD-10-CM | POA: Diagnosis not present

## 2024-02-24 DIAGNOSIS — Z471 Aftercare following joint replacement surgery: Secondary | ICD-10-CM | POA: Diagnosis not present

## 2024-02-24 DIAGNOSIS — L03116 Cellulitis of left lower limb: Secondary | ICD-10-CM | POA: Diagnosis not present

## 2024-02-24 DIAGNOSIS — I10 Essential (primary) hypertension: Secondary | ICD-10-CM | POA: Diagnosis not present

## 2024-02-24 DIAGNOSIS — K8060 Calculus of gallbladder and bile duct with cholecystitis, unspecified, without obstruction: Secondary | ICD-10-CM | POA: Diagnosis not present

## 2024-02-24 DIAGNOSIS — R262 Difficulty in walking, not elsewhere classified: Secondary | ICD-10-CM | POA: Diagnosis not present

## 2024-02-24 DIAGNOSIS — D62 Acute posthemorrhagic anemia: Secondary | ICD-10-CM | POA: Diagnosis not present

## 2024-02-24 DIAGNOSIS — R600 Localized edema: Secondary | ICD-10-CM | POA: Diagnosis not present

## 2024-02-27 DIAGNOSIS — R600 Localized edema: Secondary | ICD-10-CM | POA: Diagnosis not present

## 2024-02-27 DIAGNOSIS — M6281 Muscle weakness (generalized): Secondary | ICD-10-CM | POA: Diagnosis not present

## 2024-02-27 DIAGNOSIS — Z471 Aftercare following joint replacement surgery: Secondary | ICD-10-CM | POA: Diagnosis not present

## 2024-02-27 DIAGNOSIS — R262 Difficulty in walking, not elsewhere classified: Secondary | ICD-10-CM | POA: Diagnosis not present

## 2024-02-27 DIAGNOSIS — M1712 Unilateral primary osteoarthritis, left knee: Secondary | ICD-10-CM | POA: Diagnosis not present

## 2024-03-03 DIAGNOSIS — M1712 Unilateral primary osteoarthritis, left knee: Secondary | ICD-10-CM | POA: Diagnosis not present

## 2024-03-03 DIAGNOSIS — Z471 Aftercare following joint replacement surgery: Secondary | ICD-10-CM | POA: Diagnosis not present

## 2024-03-03 DIAGNOSIS — R600 Localized edema: Secondary | ICD-10-CM | POA: Diagnosis not present

## 2024-03-03 DIAGNOSIS — M6281 Muscle weakness (generalized): Secondary | ICD-10-CM | POA: Diagnosis not present

## 2024-03-03 DIAGNOSIS — R262 Difficulty in walking, not elsewhere classified: Secondary | ICD-10-CM | POA: Diagnosis not present

## 2024-03-05 DIAGNOSIS — Z471 Aftercare following joint replacement surgery: Secondary | ICD-10-CM | POA: Diagnosis not present

## 2024-03-05 DIAGNOSIS — M6281 Muscle weakness (generalized): Secondary | ICD-10-CM | POA: Diagnosis not present

## 2024-03-05 DIAGNOSIS — R262 Difficulty in walking, not elsewhere classified: Secondary | ICD-10-CM | POA: Diagnosis not present

## 2024-03-05 DIAGNOSIS — R600 Localized edema: Secondary | ICD-10-CM | POA: Diagnosis not present

## 2024-03-05 DIAGNOSIS — M1712 Unilateral primary osteoarthritis, left knee: Secondary | ICD-10-CM | POA: Diagnosis not present

## 2024-03-08 DIAGNOSIS — M1712 Unilateral primary osteoarthritis, left knee: Secondary | ICD-10-CM | POA: Diagnosis not present

## 2024-03-08 DIAGNOSIS — M6281 Muscle weakness (generalized): Secondary | ICD-10-CM | POA: Diagnosis not present

## 2024-03-08 DIAGNOSIS — R262 Difficulty in walking, not elsewhere classified: Secondary | ICD-10-CM | POA: Diagnosis not present

## 2024-03-08 DIAGNOSIS — R600 Localized edema: Secondary | ICD-10-CM | POA: Diagnosis not present

## 2024-03-08 DIAGNOSIS — Z471 Aftercare following joint replacement surgery: Secondary | ICD-10-CM | POA: Diagnosis not present

## 2024-03-12 DIAGNOSIS — M6281 Muscle weakness (generalized): Secondary | ICD-10-CM | POA: Diagnosis not present

## 2024-03-12 DIAGNOSIS — M1712 Unilateral primary osteoarthritis, left knee: Secondary | ICD-10-CM | POA: Diagnosis not present

## 2024-03-12 DIAGNOSIS — R262 Difficulty in walking, not elsewhere classified: Secondary | ICD-10-CM | POA: Diagnosis not present

## 2024-03-12 DIAGNOSIS — Z471 Aftercare following joint replacement surgery: Secondary | ICD-10-CM | POA: Diagnosis not present

## 2024-03-12 DIAGNOSIS — R600 Localized edema: Secondary | ICD-10-CM | POA: Diagnosis not present

## 2024-03-16 DIAGNOSIS — R600 Localized edema: Secondary | ICD-10-CM | POA: Diagnosis not present

## 2024-03-16 DIAGNOSIS — M1712 Unilateral primary osteoarthritis, left knee: Secondary | ICD-10-CM | POA: Diagnosis not present

## 2024-03-16 DIAGNOSIS — R262 Difficulty in walking, not elsewhere classified: Secondary | ICD-10-CM | POA: Diagnosis not present

## 2024-03-16 DIAGNOSIS — Z471 Aftercare following joint replacement surgery: Secondary | ICD-10-CM | POA: Diagnosis not present

## 2024-03-16 DIAGNOSIS — M6281 Muscle weakness (generalized): Secondary | ICD-10-CM | POA: Diagnosis not present

## 2024-03-18 DIAGNOSIS — R600 Localized edema: Secondary | ICD-10-CM | POA: Diagnosis not present

## 2024-03-18 DIAGNOSIS — M6281 Muscle weakness (generalized): Secondary | ICD-10-CM | POA: Diagnosis not present

## 2024-03-18 DIAGNOSIS — R262 Difficulty in walking, not elsewhere classified: Secondary | ICD-10-CM | POA: Diagnosis not present

## 2024-03-18 DIAGNOSIS — Z471 Aftercare following joint replacement surgery: Secondary | ICD-10-CM | POA: Diagnosis not present

## 2024-03-18 DIAGNOSIS — M1712 Unilateral primary osteoarthritis, left knee: Secondary | ICD-10-CM | POA: Diagnosis not present

## 2024-03-22 DIAGNOSIS — R600 Localized edema: Secondary | ICD-10-CM | POA: Diagnosis not present

## 2024-03-22 DIAGNOSIS — R262 Difficulty in walking, not elsewhere classified: Secondary | ICD-10-CM | POA: Diagnosis not present

## 2024-03-22 DIAGNOSIS — M6281 Muscle weakness (generalized): Secondary | ICD-10-CM | POA: Diagnosis not present

## 2024-03-22 DIAGNOSIS — M1712 Unilateral primary osteoarthritis, left knee: Secondary | ICD-10-CM | POA: Diagnosis not present

## 2024-03-22 DIAGNOSIS — Z471 Aftercare following joint replacement surgery: Secondary | ICD-10-CM | POA: Diagnosis not present

## 2024-03-26 DIAGNOSIS — Z471 Aftercare following joint replacement surgery: Secondary | ICD-10-CM | POA: Diagnosis not present

## 2024-03-26 DIAGNOSIS — M6281 Muscle weakness (generalized): Secondary | ICD-10-CM | POA: Diagnosis not present

## 2024-03-26 DIAGNOSIS — R262 Difficulty in walking, not elsewhere classified: Secondary | ICD-10-CM | POA: Diagnosis not present

## 2024-03-26 DIAGNOSIS — R600 Localized edema: Secondary | ICD-10-CM | POA: Diagnosis not present

## 2024-03-26 DIAGNOSIS — M1712 Unilateral primary osteoarthritis, left knee: Secondary | ICD-10-CM | POA: Diagnosis not present

## 2024-03-30 DIAGNOSIS — Z96652 Presence of left artificial knee joint: Secondary | ICD-10-CM | POA: Diagnosis not present

## 2024-03-30 DIAGNOSIS — Z79899 Other long term (current) drug therapy: Secondary | ICD-10-CM | POA: Diagnosis not present

## 2024-03-30 DIAGNOSIS — Z9889 Other specified postprocedural states: Secondary | ICD-10-CM | POA: Diagnosis not present

## 2024-03-30 DIAGNOSIS — S81802A Unspecified open wound, left lower leg, initial encounter: Secondary | ICD-10-CM | POA: Diagnosis not present

## 2024-03-30 DIAGNOSIS — M7989 Other specified soft tissue disorders: Secondary | ICD-10-CM | POA: Diagnosis not present

## 2024-03-30 DIAGNOSIS — R2242 Localized swelling, mass and lump, left lower limb: Secondary | ICD-10-CM | POA: Diagnosis not present

## 2024-03-30 DIAGNOSIS — L03116 Cellulitis of left lower limb: Secondary | ICD-10-CM | POA: Diagnosis not present

## 2024-04-05 DIAGNOSIS — Z299 Encounter for prophylactic measures, unspecified: Secondary | ICD-10-CM | POA: Diagnosis not present

## 2024-04-05 DIAGNOSIS — R0602 Shortness of breath: Secondary | ICD-10-CM | POA: Diagnosis not present

## 2024-04-05 DIAGNOSIS — I10 Essential (primary) hypertension: Secondary | ICD-10-CM | POA: Diagnosis not present

## 2024-04-19 DIAGNOSIS — M25551 Pain in right hip: Secondary | ICD-10-CM | POA: Diagnosis not present

## 2024-04-19 DIAGNOSIS — Z299 Encounter for prophylactic measures, unspecified: Secondary | ICD-10-CM | POA: Diagnosis not present

## 2024-04-19 DIAGNOSIS — R0602 Shortness of breath: Secondary | ICD-10-CM | POA: Diagnosis not present

## 2024-04-19 DIAGNOSIS — I10 Essential (primary) hypertension: Secondary | ICD-10-CM | POA: Diagnosis not present

## 2024-04-19 DIAGNOSIS — W57XXXA Bitten or stung by nonvenomous insect and other nonvenomous arthropods, initial encounter: Secondary | ICD-10-CM | POA: Diagnosis not present

## 2024-04-26 DIAGNOSIS — M7061 Trochanteric bursitis, right hip: Secondary | ICD-10-CM | POA: Diagnosis not present
